# Patient Record
Sex: Female | Born: 1980 | Race: White | Hispanic: No | Marital: Single | Smoking: Never smoker
Health system: Southern US, Community
[De-identification: ages and names within clinical notes are randomized; demographics above are authoritative.]

## PROBLEM LIST (undated history)

## (undated) DIAGNOSIS — R87629 Unspecified abnormal cytological findings in specimens from vagina: Secondary | ICD-10-CM

## (undated) HISTORY — PX: NO PAST SURGERIES: SHX2092

## (undated) HISTORY — DX: Unspecified abnormal cytological findings in specimens from vagina: R87.629

---

## 2008-09-23 ENCOUNTER — Ambulatory Visit: Payer: Self-pay | Admitting: Family Medicine

## 2008-09-24 LAB — CONVERTED CEMR LAB
Calcium: 9.3 mg/dL (ref 8.4–10.5)
HDL: 42.1 mg/dL (ref >39.00)
LDL Cholesterol: 118 mg/dL — ABNORMAL HIGH (ref 0–99)
Potassium: 3.6 meq/L (ref 3.5–5.1)
Sodium: 140 meq/L (ref 135–145)
Total CHOL/HDL Ratio: 4
Triglycerides: 91 mg/dL (ref 0.0–149.0)
VLDL: 18.2 mg/dL (ref 0.0–40.0)

## 2008-11-20 ENCOUNTER — Ambulatory Visit: Payer: Self-pay | Admitting: Family Medicine

## 2008-11-20 ENCOUNTER — Encounter: Payer: Self-pay | Admitting: Family Medicine

## 2008-11-20 ENCOUNTER — Other Ambulatory Visit: Admission: RE | Admit: 2008-11-20 | Discharge: 2008-11-20 | Payer: Self-pay | Admitting: Family Medicine

## 2008-11-27 ENCOUNTER — Encounter (INDEPENDENT_AMBULATORY_CARE_PROVIDER_SITE_OTHER): Payer: Self-pay | Admitting: *Deleted

## 2009-02-25 ENCOUNTER — Encounter: Payer: Self-pay | Admitting: Family Medicine

## 2009-02-25 ENCOUNTER — Emergency Department (HOSPITAL_COMMUNITY): Admission: EM | Admit: 2009-02-25 | Discharge: 2009-02-26 | Payer: Self-pay | Admitting: Emergency Medicine

## 2009-02-26 ENCOUNTER — Encounter: Payer: Self-pay | Admitting: Family Medicine

## 2009-02-28 ENCOUNTER — Ambulatory Visit: Payer: Self-pay | Admitting: Family Medicine

## 2009-02-28 DIAGNOSIS — R5381 Other malaise: Secondary | ICD-10-CM

## 2009-02-28 DIAGNOSIS — R5383 Other fatigue: Secondary | ICD-10-CM

## 2009-02-28 DIAGNOSIS — G988 Other disorders of nervous system: Secondary | ICD-10-CM | POA: Insufficient documentation

## 2009-03-03 ENCOUNTER — Ambulatory Visit: Payer: Self-pay | Admitting: Internal Medicine

## 2010-02-10 NOTE — Assessment & Plan Note (Signed)
Summary: F/U  ER ON 02/25/09/CLE   Vital Signs:  Patient profile:   30 year old female Height:      66 inches Weight:      162.25 pounds BMI:     26.28 Temp:     98.8 degrees F oral Pulse rate:   88 / minute Pulse rhythm:   regular BP sitting:   122 / 70  (left arm) Cuff size:   regular  Vitals Entered By: Delilah Shan CMA Duncan Dull) (February 28, 2009 3:07 PM) CC: Follow-up visit - [facility]   History of Present Illness: 30 yo female here for hospital follow up. Seen at [facility] on 2/15 after being awoken by acute episode of bilateral hand tingling, headache, nausea and palpiations. EKG, BMET, CBC, UA, upreg normal, tox screen were all neg.  Has not had another episode since then but feels her left arm remains "achy" and weaker. No one witnessed the episode, unsure if she seizues, says she was not confused after it happened. Does not think she lost consciousness or hit her head.  No recent acute illnesses, fevers.  Current Medications (verified): 1)  None  Allergies (verified): No Known Drug Allergies  Review of Systems      See HPI General:  Denies chills, fever, and loss of appetite. CV:  Denies chest pain or discomfort. Resp:  Denies shortness of breath, sputum productive, and wheezing. Neuro:  Denies brief paralysis, difficulty with concentration, disturbances in coordination, falling down, inability to speak, memory loss, and tremors.  Physical Exam  General:  Well-developed,well-nourished,in no acute distress; alert,appropriate and cooperative throughout examination Eyes:  No corneal or conjunctival inflammation noted. EOMI. Perrla. Funduscopic exam benign, without hemorrhages, exudates or papilledema. Vision grossly normal. Ears:  External ear exam shows no significant lesions or deformities.  Otoscopic examination reveals clear canals, tympanic membranes are intact bilaterally without bulging, retraction, inflammation or discharge. Hearing is  grossly normal bilaterally. Mouth:  Oral mucosa and oropharynx without lesions or exudates.  Teeth in good repair. Lungs:  Normal respiratory effort, chest expands symmetrically. Lungs are clear to auscultation, no crackles or wheezes. Heart:  Normal rate and regular rhythm. S1 and S2 normal without gallop, murmur, click, rub or other extra sounds. Abdomen:  Bowel sounds positive,abdomen soft and non-tender without masses, organomegaly or hernias noted. Msk:  No deformity or scoliosis noted of thoracic or lumbar spine.   Pulses:  R and L carotid,radial,femoral,dorsalis pedis and posterior tibial pulses are full and equal bilaterally Extremities:  no edema Neurologic:  No cranial nerve deficits noted. Station and gait are normal. Plantar reflexes are down-going bilaterally. DTRs are symmetrical throughout. Sensory, motor and coordinative functions appear intact. Strength normal and equal in all extremities, included left arm.   Impression & Recommendations:  Problem # 1:  UNSPECIFIED DISORDERS OF NERVOUS SYSTEM (ICD-349.9) Assessment New s/p unexplained epsiode of unknown origin.  Has not occured again but given her age and residual subjective arm weakness, will get CT of head.  If normal, will consider holter monitor.  Advised to go to ER over the weekend should another episode occur. Orders: Radiology Referral (Radiology)  Patient Instructions: 1)  Please stop by to see Shirlee Limerick on your way out. 2)  Go to ER if symptoms return over the weekend.  Prior Medications (reviewed today): None Current Allergies (reviewed today): No known allergies

## 2010-02-23 ENCOUNTER — Encounter: Payer: Self-pay | Admitting: Family Medicine

## 2010-02-23 ENCOUNTER — Other Ambulatory Visit: Payer: Self-pay | Admitting: Family Medicine

## 2010-02-23 ENCOUNTER — Other Ambulatory Visit (HOSPITAL_COMMUNITY)
Admission: RE | Admit: 2010-02-23 | Discharge: 2010-02-23 | Disposition: A | Discharge status: Home / Self Care | Payer: BC Managed Care – PPO | Source: Ambulatory Visit | Attending: Family Medicine | Admitting: Family Medicine

## 2010-02-23 ENCOUNTER — Encounter (INDEPENDENT_AMBULATORY_CARE_PROVIDER_SITE_OTHER): Payer: BC Managed Care – PPO | Admitting: Family Medicine

## 2010-02-23 DIAGNOSIS — R5383 Other fatigue: Secondary | ICD-10-CM

## 2010-02-23 DIAGNOSIS — E559 Vitamin D deficiency, unspecified: Secondary | ICD-10-CM

## 2010-02-23 DIAGNOSIS — Z13220 Encounter for screening for lipoid disorders: Secondary | ICD-10-CM

## 2010-02-23 DIAGNOSIS — R5381 Other malaise: Secondary | ICD-10-CM

## 2010-02-23 DIAGNOSIS — Z0000 Encounter for general adult medical examination without abnormal findings: Secondary | ICD-10-CM

## 2010-02-23 DIAGNOSIS — Z1159 Encounter for screening for other viral diseases: Secondary | ICD-10-CM | POA: Insufficient documentation

## 2010-02-23 DIAGNOSIS — Z113 Encounter for screening for infections with a predominantly sexual mode of transmission: Secondary | ICD-10-CM | POA: Insufficient documentation

## 2010-02-23 LAB — CBC WITH DIFFERENTIAL/PLATELET
Basophils Absolute: 0 10*3/uL (ref 0.0–0.1)
Basophils Relative: 0.4 % (ref 0.0–3.0)
Eosinophils Relative: 2.2 % (ref 0.0–5.0)
HCT: 36.7 % (ref 36.0–46.0)
Lymphocytes Relative: 20.9 % (ref 12.0–46.0)
Neutro Abs: 4.5 10*3/uL (ref 1.4–7.7)
Neutrophils Relative %: 69.4 % (ref 43.0–77.0)
Platelets: 419 10*3/uL — ABNORMAL HIGH (ref 150.0–400.0)
RDW: 13.2 % (ref 11.5–14.6)

## 2010-02-23 LAB — LIPID PANEL
HDL: 46.3 mg/dL (ref >39.00)
Total CHOL/HDL Ratio: 4
Triglycerides: 45 mg/dL (ref 0.0–149.0)
VLDL: 9 mg/dL (ref 0.0–40.0)

## 2010-02-23 LAB — BASIC METABOLIC PANEL
CO2: 26 mEq/L (ref 19–32)
Chloride: 102 mEq/L (ref 96–112)
Potassium: 4.1 mEq/L (ref 3.5–5.1)

## 2010-02-23 LAB — TSH: TSH: 0.92 u[IU]/mL (ref 0.35–5.50)

## 2010-02-23 LAB — B12 AND FOLATE PANEL
Folate: 13.3 ng/mL (ref >5.9)
Vitamin B-12: 311 pg/mL (ref 211–911)

## 2010-02-26 ENCOUNTER — Encounter: Payer: Self-pay | Admitting: Family Medicine

## 2010-03-04 NOTE — Letter (Signed)
Summary: Results Follow up Letter  [place] [facility]  [address] [place], [place] [address]   Phone: [phone]  Fax: [phone]    02/26/2010 MRN: [id number]  Renee Bean [address] [place], [place]  [address]  [place]  Dear Ms. Trovato,  The following are the results of your recent test(s):  Test         Result    Pap Smear:        Normal __X___  Not Normal _____ Comments: GC/Chlamydia neg ______________________________________________________ Cholesterol: LDL(Bad cholesterol):         Your goal is less than:         HDL (Good cholesterol):       Your goal is more than: Comments:  ______________________________________________________ Mammogram:        Normal _____  Not Normal _____ Comments:  ___________________________________________________________________ Hemoccult:        Normal _____  Not normal _______ Comments:    _____________________________________________________________________ Other Tests:    We routinely do not discuss normal results over the telephone.  If you desire a copy of the results, or you have any questions about this information we can discuss them at your next office visit.   Sincerely,      Dr. Ruthe Mannan

## 2010-03-04 NOTE — Assessment & Plan Note (Signed)
Summary: CPX   Vital Signs:  Patient profile:   30 year old female Height:      66 inches Weight:      169.75 pounds BMI:     27.50 Temp:     97.3 degrees F oral Pulse rate:   81 / minute Pulse rhythm:   regular BP sitting:   120 / 78  (left arm) Cuff size:   regular  Vitals Entered By: Linde Gillis CMA Duncan Dull) (February 23, 2010 9:22 AM) CC: complete physical with pap   History of Present Illness: 30 yo here for CPX.   G0, sexually active with same partner for several years, uses condoms.  Has an aunt that had some kid of "female cancer," but she is unsure of type.  No h/o abnormal pap smears.  Periods are regular and not too heavy.  SOB- over past few months, notices DOE when she is climbing stairs at work.  No cough, wheezing or CP.  No SOB at rest.  Does not feel dizzy when she stands up.   Pt is a non smoker. Admits to not exercising as much as she has in past. Does feel a little more fatigued.   Current Medications (verified): 1)  None  Allergies (verified): No Known Drug Allergies  Past History:  Family History: Last updated: 09/23/2008 Mom diagnosed with colon CA at 23 cousins and grandparents- DM II, one cousin with DM Type I HTN-both parents, both sets of grandparents.  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  Does not drink or smokee.  Monogamous with boyfriend who lives in [place].    Risk Factors: Alcohol Use: 0 (09/23/2008) Exercise: yes (09/23/2008)  Risk Factors: Smoking Status: never (09/23/2008)  Review of Systems      See HPI General:  Complains of fatigue. Eyes:  Denies blurring. ENT:  Denies difficulty swallowing. CV:  Denies chest pain or discomfort. Resp:  Complains of shortness of breath; denies chest discomfort, chest pain with inspiration, cough, and wheezing. GI:  Denies abdominal pain and bloody stools. GU:  Denies abnormal vaginal bleeding and  dysuria. MS:  Denies joint pain, joint redness, and joint swelling. Derm:  Denies rash. Neuro:  Denies headaches. Psych:  Denies anxiety and depression. Endo:  Denies cold intolerance, heat intolerance, and polyuria. Heme:  Denies abnormal bruising and bleeding.  Physical Exam  General:  Well-developed,well-nourished,in no acute distress; alert,appropriate and cooperative throughout examination Head:  normocephalic, atraumatic, no abnormalities observed, and no abnormalities palpated.   Eyes:  vision grossly intact, pupils equal, pupils round, and pupils reactive to light.   Ears:  R ear normal and L ear normal.   Nose:  no external deformity.   Mouth:  good dentition.   Neck:  No deformities, masses, or tenderness noted. Breasts:  No mass, nodules, thickening, tenderness, bulging, retraction, inflamation, nipple discharge or skin changes noted.   Lungs:  Normal respiratory effort, chest expands symmetrically. Lungs are clear to auscultation, no crackles or wheezes. Heart:  Normal rate and regular rhythm. S1 and S2 normal without gallop, murmur, click, rub or other extra sounds. Abdomen:  Bowel sounds positive,abdomen soft and non-tender without masses, organomegaly or hernias noted. Rectal:  no external abnormalities.   Genitalia:  Pelvic Exam:        External: normal female genitalia without lesions or masses        Vagina: normal without lesions or masses  Cervix: normal without lesions or masses        Adnexa: normal bimanual exam without masses or fullness        Uterus: normal by palpation        Pap smear: performed Msk:  No deformity or scoliosis noted of thoracic or lumbar spine.   Extremities:  no edema Neurologic:  alert & oriented X3, cranial nerves II-XII intact, and gait normal.   Skin:  Intact without suspicious lesions or rashes Cervical Nodes:  No lymphadenopathy noted Axillary Nodes:  No palpable lymphadenopathy Psych:  Cognition and judgment appear intact.  Alert and cooperative with normal attention span and concentration. No apparent delusions, illusions, hallucinations   Impression & Recommendations:  Problem # 1:  Preventive Health Care (ICD-V70.0) Reviewed preventive care protocols, scheduled due services, and updated immunizations Discussed nutrition, exercise, diet, and healthy lifestyle.  Pap smear today. BMET, fasting lipid panel.  Problem # 2:  FATIGUE (ICD-780.79) Assessment: New with dyspnea on exertion. ?deconditioning.  Lungs clear on exam. Will check blood work to rule out other possible causes. Pt is low risk for chest malignancy or other pathology.  If blood work normal and symptoms persist, consider CXR. Orders: TLB-B12 + Folate Pnl (04540_98119-J47/WGN) TLB-CBC Platelet - w/Differential (85025-CBCD) TLB-TSH (Thyroid Stimulating Hormone) (84443-TSH) TLB-BMP (Basic Metabolic Panel-BMET) (80048-METABOL)  Other Orders: Venipuncture (56213) TLB-Lipid Panel (80061-LIPID) T-Vitamin D (25-Hydroxy) (08657-84696) Specimen Handling (29528)   Orders Added: 1)  Venipuncture [41324] 2)  TLB-Lipid Panel [80061-LIPID] 3)  TLB-B12 + Folate Pnl [82746_82607-B12/FOL] 4)  TLB-CBC Platelet - w/Differential [85025-CBCD] 5)  TLB-TSH (Thyroid Stimulating Hormone) [84443-TSH] 6)  TLB-BMP (Basic Metabolic Panel-BMET) [80048-METABOL] 7)  T-Vitamin D (25-Hydroxy) [40102-72536] 8)  Specimen Handling [99000] 9)  Est. Patient 18-39 years [99395] 10)  Est. Patient Level II [64403]    Prior Medications (reviewed today): None Current Allergies (reviewed today): No known allergies

## 2010-04-02 LAB — CBC
HCT: 35.7 % — ABNORMAL LOW (ref 36.0–46.0)
Platelets: 356 10*3/uL (ref 150–400)
RDW: 12.6 % (ref 11.5–15.5)
WBC: 9.2 10*3/uL (ref 4.0–10.5)

## 2010-04-02 LAB — URINALYSIS, ROUTINE W REFLEX MICROSCOPIC
Nitrite: NEGATIVE
Protein, ur: NEGATIVE mg/dL
Specific Gravity, Urine: 1.015 (ref 1.005–1.030)
Urobilinogen, UA: 0.2 mg/dL (ref 0.0–1.0)

## 2010-04-02 LAB — BASIC METABOLIC PANEL
BUN: 9 mg/dL (ref 6–23)
Calcium: 9.5 mg/dL (ref 8.4–10.5)
Creatinine, Ser: 0.67 mg/dL (ref 0.4–1.2)
GFR calc non Af Amer: >60 mL/min (ref >60)
Glucose, Bld: 99 mg/dL (ref 70–99)
Potassium: 3.2 mEq/L — ABNORMAL LOW (ref 3.5–5.1)

## 2010-04-02 LAB — PREGNANCY, URINE: Preg Test, Ur: NEGATIVE

## 2010-04-02 LAB — ETHANOL: Alcohol, Ethyl (B): <5 mg/dL (ref 0–10)

## 2010-04-02 LAB — RAPID URINE DRUG SCREEN, HOSP PERFORMED
Amphetamines: NOT DETECTED
Barbiturates: NOT DETECTED
Opiates: NOT DETECTED

## 2010-06-25 ENCOUNTER — Encounter: Payer: Self-pay | Admitting: Family Medicine

## 2010-06-25 ENCOUNTER — Ambulatory Visit (INDEPENDENT_AMBULATORY_CARE_PROVIDER_SITE_OTHER): Payer: BC Managed Care – PPO | Admitting: Family Medicine

## 2010-06-25 VITALS — BP 110/80 | HR 79 | Temp 98.4°F | Ht 66.0 in | Wt 168.5 lb

## 2010-06-25 DIAGNOSIS — R51 Headache: Secondary | ICD-10-CM

## 2010-06-25 MED ORDER — ESOMEPRAZOLE MAGNESIUM 40 MG PO CPDR: 40.0000 mg | DELAYED_RELEASE_CAPSULE | Freq: Every day | ORAL | Status: DC

## 2010-06-25 NOTE — Patient Instructions (Signed)
Your headaches seem to be related to sinus congestion. Try claritin D or zyrtec D over the counter- two times a day as needed ( have to sign for them at pharmacy).  Call if not improving as expected in 5-7 days.

## 2010-06-25 NOTE — Progress Notes (Signed)
30 yo here for headaches.  Had some sinus congestion for past few days and intermittent headaches as well. Headaches are bilateral frontal, right ear was hurting as well. No cough, fever, chills, SOB or CP.   No nausea, vomiting, photophobia or focal neurological deficits.  Family History  Problem Relation Age of Onset  . Cancer Mother 5    colon  . Hypertension Mother   . Hypertension Father   . Hypertension Maternal Grandmother   . Diabetes Maternal Grandmother   . Hypertension Maternal Grandfather   . Diabetes Maternal Grandfather   . Hypertension Paternal Grandmother   . Diabetes Paternal Grandmother   . Hypertension Paternal Grandfather   . Diabetes Paternal Grandfather     Review of Systems       See HPI   Physical Exam BP 110/80  Pulse 79  Temp(Src) 98.4 F (36.9 C) (Oral)  Ht 5\' 6"  (1.676 m)  Wt 168 lb 8 oz (76.431 kg)  BMI 27.20 kg/m2  LMP 06/08/2010 General:  Well-developed,well-nourished,in no acute distress; alert,appropriate and cooperative throughout examination Head:  normocephalic, atraumatic, no abnormalities observed, and no abnormalities palpated.   Eyes:  vision grossly intact, pupils equal, pupils round, and pupils reactive to light.   Ears:  R ear dull, Left TM normal. Nose:  Mild erythema, sinuses NTTP Mouth:  good dentition.   Neck:  No deformities, masses, or tenderness noted. Lungs:  Normal respiratory effort, chest expands symmetrically. Lungs are clear to auscultation, no crackles or wheezes. Heart:  Normal rate and regular rhythm. S1 and S2 normal without gallop, murmur, click, rub or other extra sounds. Abdomen:  Bowel sounds positive,abdomen soft and non-tender without masses, organomegaly or hernias noted. Msk:  No deformity or scoliosis noted of thoracic or lumbar spine.   Extremities:  no edema Neurologic:  alert & oriented X3, cranial nerves II-XII intact, and gait normal.   Skin:  Intact without suspicious lesions or  rashes Cervical Nodes:  No lymphadenopathy noted Axillary Nodes:  No palpable lymphadenopathy Psych:  Cognition and judgment appear intact. Alert and cooperative with normal attention span and concentration. No apparent delusions, illusions, hallucinations  1. Generalized headaches    New- most consistent with sinus related headaches. Supportive care, see pt instructions for details.

## 2010-10-19 ENCOUNTER — Other Ambulatory Visit: Payer: Self-pay | Admitting: Family Medicine

## 2012-05-30 ENCOUNTER — Telehealth: Payer: Self-pay | Admitting: Family Medicine

## 2012-05-30 NOTE — Telephone Encounter (Signed)
Ok to accommodate but not last appt of day. Thanks.

## 2012-05-30 NOTE — Telephone Encounter (Signed)
Pt has a CPE scheduled for July, but her ins is requiring she have one prior to the end of June. Can you accommodate her an apptmt for this?

## 2012-05-30 NOTE — Telephone Encounter (Signed)
Pt r/s for 06/08/2012

## 2012-06-08 ENCOUNTER — Ambulatory Visit (INDEPENDENT_AMBULATORY_CARE_PROVIDER_SITE_OTHER): Payer: BC Managed Care – PPO | Admitting: Family Medicine

## 2012-06-08 ENCOUNTER — Other Ambulatory Visit (HOSPITAL_COMMUNITY)
Admission: RE | Admit: 2012-06-08 | Discharge: 2012-06-08 | Disposition: A | Discharge status: Home / Self Care | Payer: BC Managed Care – PPO | Source: Ambulatory Visit | Attending: Family Medicine | Admitting: Family Medicine

## 2012-06-08 ENCOUNTER — Encounter: Payer: Self-pay | Admitting: Family Medicine

## 2012-06-08 VITALS — BP 120/88 | HR 87 | Temp 97.6°F | Ht 66.0 in | Wt 178.2 lb

## 2012-06-08 DIAGNOSIS — Z7689 Persons encountering health services in other specified circumstances: Secondary | ICD-10-CM | POA: Insufficient documentation

## 2012-06-08 DIAGNOSIS — Z136 Encounter for screening for cardiovascular disorders: Secondary | ICD-10-CM

## 2012-06-08 DIAGNOSIS — Z01419 Encounter for gynecological examination (general) (routine) without abnormal findings: Secondary | ICD-10-CM | POA: Insufficient documentation

## 2012-06-08 DIAGNOSIS — Z0000 Encounter for general adult medical examination without abnormal findings: Secondary | ICD-10-CM

## 2012-06-08 LAB — COMPREHENSIVE METABOLIC PANEL
Albumin: 4 g/dL (ref 3.5–5.2)
Alkaline Phosphatase: 68 U/L (ref 39–117)
BUN: 10 mg/dL (ref 6–23)
Calcium: 9.7 mg/dL (ref 8.4–10.5)
Chloride: 106 mEq/L (ref 96–112)
Glucose, Bld: 88 mg/dL (ref 70–99)
Potassium: 3.6 mEq/L (ref 3.5–5.1)
Sodium: 137 mEq/L (ref 135–145)
Total Protein: 7.4 g/dL (ref 6.0–8.3)

## 2012-06-08 LAB — CBC WITH DIFFERENTIAL/PLATELET
Basophils Relative: 0.6 % (ref 0.0–3.0)
HCT: 37.5 % (ref 36.0–46.0)
Hemoglobin: 12.8 g/dL (ref 12.0–15.0)
Lymphocytes Relative: 20.2 % (ref 12.0–46.0)
Lymphs Abs: 1.4 10*3/uL (ref 0.7–4.0)
MCHC: 34 g/dL (ref 30.0–36.0)
Monocytes Relative: 6.6 % (ref 3.0–12.0)
Neutro Abs: 4.7 10*3/uL (ref 1.4–7.7)
RBC: 4.51 Mil/uL (ref 3.87–5.11)

## 2012-06-08 LAB — LIPID PANEL
Cholesterol: 195 mg/dL (ref 0–200)
LDL Cholesterol: 129 mg/dL — ABNORMAL HIGH (ref 0–99)
Total CHOL/HDL Ratio: 4
Triglycerides: 103 mg/dL (ref 0.0–149.0)

## 2012-06-08 NOTE — Progress Notes (Signed)
Subjective:    Patient ID: Renee Bean, female    DOB: Oct 14, 1980, 32 y.o.   MRN: [id number]  HPI  32 yo female whom I have not seen in 2 years here for CPX.  G0, sexually active with same partner for several years, uses condoms. Has an aunt that had some kid of "female cancer," but she is unsure of type.  No h/o abnormal pap smears. Periods are regular and not too heavy.  She has no complaints today.  Lab Results  Component Value Date   CHOL 177 02/23/2010   HDL 46.30 02/23/2010   LDLCALC 122* 02/23/2010   TRIG 45.0 02/23/2010   CHOLHDL 4 02/23/2010   Patient Active Problem List   Diagnosis Date Noted  . Routine general medical examination at a health care facility 06/08/2012  . Generalized headaches 06/25/2010  . UNSPECIFIED DISORDERS OF NERVOUS SYSTEM 02/28/2009   No past medical history on file. No past surgical history on file. History  Substance Use Topics  . Smoking status: Never Smoker   . Smokeless tobacco: Not on file  . Alcohol Use: No   Family History  Problem Relation Age of Onset  . Cancer Mother 28    colon  . Hypertension Mother   . Hypertension Father   . Hypertension Maternal Grandmother   . Diabetes Maternal Grandmother   . Hypertension Maternal Grandfather   . Diabetes Maternal Grandfather   . Hypertension Paternal Grandmother   . Diabetes Paternal Grandmother   . Hypertension Paternal Grandfather   . Diabetes Paternal Grandfather    No Known Allergies No current outpatient prescriptions on file prior to visit.   No current facility-administered medications on file prior to visit.   The PMH, PSH, Social History, Family History, Medications, and allergies have been reviewed in [facility], and have been updated if relevant.    Review of Systems See HPI Patient reports no  vision/ hearing changes,anorexia, weight change, fever ,adenopathy, persistant / recurrent hoarseness, swallowing issues, chest pain, edema,persistant / recurrent cough,  hemoptysis, dyspnea(rest, exertional, paroxysmal nocturnal), gastrointestinal  bleeding (melena, rectal bleeding), abdominal pain, excessive heart burn, GU symptoms(dysuria, hematuria, pyuria, voiding/incontinence  Issues) syncope, focal weakness, severe memory loss, concerning skin lesions, depression, anxiety, abnormal bruising/bleeding, major joint swelling, breast masses or abnormal vaginal bleeding.       Objective:   Physical Exam BP 120/88  Pulse 87  Temp(Src) 97.6 F (36.4 C) (Oral)  Ht 5\' 6"  (1.676 m)  Wt 178 lb 4 oz (80.854 kg)  BMI 28.78 kg/m2  SpO2 98%  General:  Well-developed,well-nourished,in no acute distress; alert,appropriate and cooperative throughout examination Head:  normocephalic and atraumatic.   Eyes:  vision grossly intact, pupils equal, pupils round, and pupils reactive to light.   Ears:  R ear normal and L ear normal.   Nose:  no external deformity.   Mouth:  good dentition.   Neck:  No deformities, masses, or tenderness noted. Breasts:  No mass, nodules, thickening, tenderness, bulging, retraction, inflamation, nipple discharge or skin changes noted.   Lungs:  Normal respiratory effort, chest expands symmetrically. Lungs are clear to auscultation, no crackles or wheezes. Heart:  Normal rate and regular rhythm. S1 and S2 normal without gallop, murmur, click, rub or other extra sounds. Abdomen:  Bowel sounds positive,abdomen soft and non-tender without masses, organomegaly or hernias noted. Rectal:  no external abnormalities.   Genitalia:  Pelvic Exam:        External: normal female genitalia without lesions or masses  Vagina: normal without lesions or masses        Cervix: normal without lesions or masses        Adnexa: normal bimanual exam without masses or fullness        Uterus: normal by palpation        Pap smear: performed Msk:  No deformity or scoliosis noted of thoracic or lumbar spine.   Extremities:  No clubbing, cyanosis, edema, or  deformity noted with normal full range of motion of all joints.   Neurologic:  alert & oriented X3 and gait normal.   Skin:  Intact without suspicious lesions or rashes Cervical Nodes:  No lymphadenopathy noted Axillary Nodes:  No palpable lymphadenopathy Psych:  Cognition and judgment appear intact. Alert and cooperative with normal attention span and concentration. No apparent delusions, illusions, hallucinations        Assessment & Plan:  1. Routine general medical examination at a health care facility Reviewed preventive care protocols, scheduled due services, and updated immunizations Discussed nutrition, exercise, diet, and healthy lifestyle.  Mom had colon CA at 53- discussed screening colonoscopy at 73.  The patient indicates understanding of these issues and agrees with the plan.   - Cytology - PAP - Comprehensive metabolic panel - CBC with Differential  2. Screening for ischemic heart disease  - Lipid Panel

## 2012-06-08 NOTE — Patient Instructions (Addendum)
Great to see you. We will call you with your lab and pap smear results.  Have a great summer.

## 2012-06-09 ENCOUNTER — Encounter: Payer: Self-pay | Admitting: *Deleted

## 2012-06-13 ENCOUNTER — Encounter: Payer: Self-pay | Admitting: *Deleted

## 2012-06-13 ENCOUNTER — Encounter: Payer: Self-pay | Admitting: Family Medicine

## 2012-07-24 ENCOUNTER — Encounter: Payer: BC Managed Care – PPO | Admitting: Family Medicine

## 2012-09-12 ENCOUNTER — Ambulatory Visit (INDEPENDENT_AMBULATORY_CARE_PROVIDER_SITE_OTHER): Payer: BC Managed Care – PPO | Admitting: Family Medicine

## 2012-09-12 ENCOUNTER — Ambulatory Visit (INDEPENDENT_AMBULATORY_CARE_PROVIDER_SITE_OTHER)
Admission: RE | Admit: 2012-09-12 | Discharge: 2012-09-12 | Disposition: A | Discharge status: Home / Self Care | Payer: BC Managed Care – PPO | Source: Ambulatory Visit | Attending: Family Medicine | Admitting: Family Medicine

## 2012-09-12 ENCOUNTER — Encounter: Payer: Self-pay | Admitting: Family Medicine

## 2012-09-12 VITALS — BP 110/80 | HR 80 | Temp 98.6°F | Ht 66.0 in | Wt 174.0 lb

## 2012-09-12 DIAGNOSIS — M25569 Pain in unspecified knee: Secondary | ICD-10-CM

## 2012-09-12 DIAGNOSIS — M25561 Pain in right knee: Secondary | ICD-10-CM | POA: Insufficient documentation

## 2012-09-12 NOTE — Patient Instructions (Addendum)
Good to see you. Let's get an xray today.  We will call you with xray results.  Continue Ibuprofen/Alleve with food.  Heat may help as well.

## 2012-09-12 NOTE — Progress Notes (Signed)
SUBJECTIVE: Renee Bean is a 32 y.o. female who complaints of right knee pain and swelling x 2 weeks.  No known injury.   Woke up one morning with these symptoms.  Pain worsens after she has been on her feet all day or if she bends down.   Prior history of related problems: no prior problems with this area in the past.  Patient Active Problem List   Diagnosis Date Noted  . Right knee pain 09/12/2012   No past medical history on file. No past surgical history on file. History  Substance Use Topics  . Smoking status: Never Smoker   . Smokeless tobacco: Not on file  . Alcohol Use: No   Family History  Problem Relation Age of Onset  . Cancer Mother 41    colon  . Hypertension Mother   . Hypertension Father   . Hypertension Maternal Grandmother   . Diabetes Maternal Grandmother   . Hypertension Maternal Grandfather   . Diabetes Maternal Grandfather   . Hypertension Paternal Grandmother   . Diabetes Paternal Grandmother   . Hypertension Paternal Grandfather   . Diabetes Paternal Grandfather    No Known Allergies No current outpatient prescriptions on file prior to visit.   No current facility-administered medications on file prior to visit.   The PMH, PSH, Social History, Family History, Medications, and allergies have been reviewed in [facility], and have been updated if relevant.  OBJECTIVE: BP 110/80  Pulse 80  Temp(Src) 98.6 F (37 C)  Ht 5\' 6"  (1.676 m)  Wt 174 lb (78.926 kg)  BMI 28.1 kg/m2  Appearance: alert, well appearing, and in no distress and oriented to person, place, and time. Knee exam: effusion, reduced range of motion, negative drawer sign, negative Lachman sign. Normal gait X-ray: ordered, but results not yet available.  ASSESSMENT: Knee pain/swelling- etiology unclear- ?baker's cyst vs sprain  PLAN: rest the injured area as much as practical, X-Ray ordered Advised NSAIDS with food. See orders for this visit as documented in the electronic medical  record.

## 2013-06-11 ENCOUNTER — Encounter: Payer: Self-pay | Admitting: Internal Medicine

## 2013-06-11 ENCOUNTER — Ambulatory Visit (INDEPENDENT_AMBULATORY_CARE_PROVIDER_SITE_OTHER): Payer: BC Managed Care – PPO | Admitting: Internal Medicine

## 2013-06-11 VITALS — BP 104/64 | HR 91 | Temp 98.2°F | Ht 65.5 in | Wt 175.0 lb

## 2013-06-11 DIAGNOSIS — Z0000 Encounter for general adult medical examination without abnormal findings: Secondary | ICD-10-CM

## 2013-06-11 LAB — CBC
HCT: 37.8 % (ref 36.0–46.0)
HEMOGLOBIN: 12.6 g/dL (ref 12.0–15.0)
MCHC: 33.3 g/dL (ref 30.0–36.0)
MCV: 81.6 fl (ref 78.0–100.0)
Platelets: 428 10*3/uL — ABNORMAL HIGH (ref 150.0–400.0)
RBC: 4.64 Mil/uL (ref 3.87–5.11)
RDW: 13.8 % (ref 11.5–15.5)
WBC: 7.1 10*3/uL (ref 4.0–10.5)

## 2013-06-11 LAB — COMPREHENSIVE METABOLIC PANEL
ALT: 12 U/L (ref 0–35)
AST: 13 U/L (ref 0–37)
Albumin: 4 g/dL (ref 3.5–5.2)
Alkaline Phosphatase: 65 U/L (ref 39–117)
BUN: 8 mg/dL (ref 6–23)
CALCIUM: 9.4 mg/dL (ref 8.4–10.5)
CHLORIDE: 108 meq/L (ref 96–112)
CO2: 25 meq/L (ref 19–32)
Creatinine, Ser: 0.7 mg/dL (ref 0.4–1.2)
GFR: 110.16 mL/min (ref >60.00)
Glucose, Bld: 89 mg/dL (ref 70–99)
POTASSIUM: 3.9 meq/L (ref 3.5–5.1)
SODIUM: 139 meq/L (ref 135–145)
TOTAL PROTEIN: 7.5 g/dL (ref 6.0–8.3)
Total Bilirubin: 0.5 mg/dL (ref 0.2–1.2)

## 2013-06-11 LAB — TSH: TSH: 1.16 u[IU]/mL (ref 0.35–4.50)

## 2013-06-11 LAB — LIPID PANEL
CHOL/HDL RATIO: 4
Cholesterol: 183 mg/dL (ref 0–200)
HDL: 41.6 mg/dL (ref >39.00)
LDL CALC: 124 mg/dL — AB (ref 0–99)
Triglycerides: 89 mg/dL (ref 0.0–149.0)
VLDL: 17.8 mg/dL (ref 0.0–40.0)

## 2013-06-11 NOTE — Patient Instructions (Addendum)
Health Maintenance, Female A healthy lifestyle and preventative care can promote health and wellness.  Maintain regular health, dental, and eye exams.  Eat a healthy diet. Foods like vegetables, fruits, whole grains, low-fat dairy products, and lean protein foods contain the nutrients you need without too many calories. Decrease your intake of foods high in solid fats, added sugars, and salt. Get information about a proper diet from your caregiver, if necessary.  Regular physical exercise is one of the most important things you can do for your health. Most adults should get at least 150 minutes of moderate-intensity exercise (any activity that increases your heart rate and causes you to sweat) each week. In addition, most adults need muscle-strengthening exercises on 2 or more days a week.   Maintain a healthy weight. The body mass index (BMI) is a screening tool to identify possible weight problems. It provides an estimate of body fat based on height and weight. Your caregiver can help determine your BMI, and can help you achieve or maintain a healthy weight. For adults 20 years and older:  A BMI below 18.5 is considered underweight.  A BMI of 18.5 to 24.9 is normal.  A BMI of 25 to 29.9 is considered overweight.  A BMI of 30 and above is considered obese.  Maintain normal blood lipids and cholesterol by exercising and minimizing your intake of saturated fat. Eat a balanced diet with plenty of fruits and vegetables. Blood tests for lipids and cholesterol should begin at age 20 and be repeated every 5 years. If your lipid or cholesterol levels are high, you are over 50, or you are a high risk for heart disease, you may need your cholesterol levels checked more frequently.Ongoing high lipid and cholesterol levels should be treated with medicines if diet and exercise are not effective.  If you smoke, find out from your caregiver how to quit. If you do not use tobacco, do not start.  Lung  cancer screening is recommended for adults aged 55 80 years who are at high risk for developing lung cancer because of a history of smoking. Yearly low-dose computed tomography (CT) is recommended for people who have at least a 30-pack-year history of smoking and are a current smoker or have quit within the past 15 years. A pack year of smoking is smoking an average of 1 pack of cigarettes a day for 1 year (for example: 1 pack a day for 30 years or 2 packs a day for 15 years). Yearly screening should continue until the smoker has stopped smoking for at least 15 years. Yearly screening should also be stopped for people who develop a health problem that would prevent them from having lung cancer treatment.  If you are pregnant, do not drink alcohol. If you are breastfeeding, be very cautious about drinking alcohol. If you are not pregnant and choose to drink alcohol, do not exceed 1 drink per day. One drink is considered to be 12 ounces (355 mL) of beer, 5 ounces (148 mL) of wine, or 1.5 ounces (44 mL) of liquor.  Avoid use of street drugs. Do not share needles with anyone. Ask for help if you need support or instructions about stopping the use of drugs.  High blood pressure causes heart disease and increases the risk of stroke. Blood pressure should be checked at least every 1 to 2 years. Ongoing high blood pressure should be treated with medicines, if weight loss and exercise are not effective.  If you are 55 to   35 years old, ask your caregiver if you should take aspirin to prevent strokes.  Diabetes screening involves taking a blood sample to check your fasting blood sugar level. This should be done once every 3 years, after age 53, if you are within normal weight and without risk factors for diabetes. Testing should be considered at a younger age or be carried out more frequently if you are overweight and have at least 1 risk factor for diabetes.  Breast cancer screening is essential preventative care  for women. You should practice "breast self-awareness." This means understanding the normal appearance and feel of your breasts and may include breast self-examination. Any changes detected, no matter how small, should be reported to a caregiver. Women in their 67s and 30s should have a clinical breast exam (CBE) by a caregiver as part of a regular health exam every 1 to 3 years. After age 12, women should have a CBE every year. Starting at age 83, women should consider having a mammogram (breast X-ray) every year. Women who have a family history of breast cancer should talk to their caregiver about genetic screening. Women at a high risk of breast cancer should talk to their caregiver about having an MRI and a mammogram every year.  Breast cancer gene (BRCA)-related cancer risk assessment is recommended for women who have family members with BRCA-related cancers. BRCA-related cancers include breast, ovarian, tubal, and peritoneal cancers. Having family members with these cancers may be associated with an increased risk for harmful changes (mutations) in the breast cancer genes BRCA1 and BRCA2. Results of the assessment will determine the need for genetic counseling and BRCA1 and BRCA2 testing.  The Pap test is a screening test for cervical cancer. Women should have a Pap test starting at age 46. Between ages 48 and 97, Pap tests should be repeated every 2 years. Beginning at age 34, you should have a Pap test every 3 years as long as the past 3 Pap tests have been normal. If you had a hysterectomy for a problem that was not cancer or a condition that could lead to cancer, then you no longer need Pap tests. If you are between ages 47 and 58, and you have had normal Pap tests going back 10 years, you no longer need Pap tests. If you have had past treatment for cervical cancer or a condition that could lead to cancer, you need Pap tests and screening for cancer for at least 20 years after your treatment. If Pap  tests have been discontinued, risk factors (such as a new sexual partner) need to be reassessed to determine if screening should be resumed. Some women have medical problems that increase the chance of getting cervical cancer. In these cases, your caregiver may recommend more frequent screening and Pap tests.  The human papillomavirus (HPV) test is an additional test that may be used for cervical cancer screening. The HPV test looks for the virus that can cause the cell changes on the cervix. The cells collected during the Pap test can be tested for HPV. The HPV test could be used to screen women aged 41 years and older, and should be used in women of any age who have unclear Pap test results. After the age of 80, women should have HPV testing at the same frequency as a Pap test.  Colorectal cancer can be detected and often prevented. Most routine colorectal cancer screening begins at the age of 75 and continues through age 63. However, your caregiver  may recommend screening at an earlier age if you have risk factors for colon cancer. On a yearly basis, your caregiver may provide home test kits to check for hidden blood in the stool. Use of a small camera at the end of a tube, to directly examine the colon (sigmoidoscopy or colonoscopy), can detect the earliest forms of colorectal cancer. Talk to your caregiver about this at age 73, when routine screening begins. Direct examination of the colon should be repeated every 5 to 10 years through age 61, unless early forms of pre-cancerous polyps or small growths are found.  Hepatitis C blood testing is recommended for all people born from [birth year] through 1967 and any individual with known risks for hepatitis C.  Practice safe sex. Use condoms and avoid high-risk sexual practices to reduce the spread of sexually transmitted infections (STIs). Sexually active women aged 38 and younger should be checked for Chlamydia, which is a common sexually transmitted infection.  Older women with new or multiple partners should also be tested for Chlamydia. Testing for other STIs is recommended if you are sexually active and at increased risk.  Osteoporosis is a disease in which the bones lose minerals and strength with aging. This can result in serious bone fractures. The risk of osteoporosis can be identified using a bone density scan. Women ages 44 and over and women at risk for fractures or osteoporosis should discuss screening with their caregivers. Ask your caregiver whether you should be taking a calcium supplement or vitamin D to reduce the rate of osteoporosis.  Menopause can be associated with physical symptoms and risks. Hormone replacement therapy is available to decrease symptoms and risks. You should talk to your caregiver about whether hormone replacement therapy is right for you.  Use sunscreen. Apply sunscreen liberally and repeatedly throughout the day. You should seek shade when your shadow is shorter than you. Protect yourself by wearing long sleeves, pants, a wide-brimmed hat, and sunglasses year round, whenever you are outdoors.  Notify your caregiver of new moles or changes in moles, especially if there is a change in shape or color. Also notify your caregiver if a mole is larger than the size of a pencil eraser.  Stay current with your immunizations. Document Released: 07/13/2010 Document Revised: 04/24/2012 Document Reviewed: 07/13/2010 [facility] Patient Information 2014 [place].

## 2013-06-11 NOTE — Progress Notes (Signed)
Subjective:    Patient ID: Renee Bean, female    DOB: [birth year], 33 y.o.   MRN: [id number]  HPI  Pt presents to the clinic today for her annual physical. She has no concerns today.  Flu: never Tetanus: 2010 LMP: 05/12/13 Pap Smear: 06/13/2012 Dentist: [facility]  Review of Systems      No past medical history on file.  No current outpatient prescriptions on file.   No current facility-administered medications for this visit.    No Known Allergies  Family History  Problem Relation Age of Onset  . Cancer Mother 13    colon  . Hypertension Mother   . Hypertension Father   . Hypertension Maternal Grandmother   . Diabetes Maternal Grandmother   . Hypertension Maternal Grandfather   . Diabetes Maternal Grandfather   . Hypertension Paternal Grandmother   . Diabetes Paternal Grandmother   . Hypertension Paternal Grandfather   . Diabetes Paternal Grandfather     History   Social History  . Marital Status: Single    Spouse Name: N/A    Number of Children: N/A  . Years of Education: N/A   Occupational History  . Not on file.   Social History Main Topics  . Smoking status: Never Smoker   . Smokeless tobacco: Not on file  . Alcohol Use: No  . Drug Use:   . Sexual Activity:    Other Topics Concern  . 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  Monogamous with boyfriend who lives in [place].     Constitutional: Denies fever, malaise, fatigue, headache or abrupt weight changes.  HEENT: Denies eye pain, eye redness, ear pain, ringing in the ears, wax buildup, runny nose, nasal congestion, bloody nose, or sore throat. Respiratory: Denies difficulty breathing, shortness of breath, cough or sputum production.   Cardiovascular: Denies chest pain, chest tightness, palpitations or swelling in the hands or feet.  Gastrointestinal: Pt reports reflux. Denies abdominal pain, bloating,  constipation, diarrhea or blood in the stool.  GU: Denies urgency, frequency, pain with urination, burning sensation, blood in urine, odor or discharge. Musculoskeletal: Denies decrease in range of motion, difficulty with gait, muscle pain or joint pain and swelling.  Skin: Denies redness, rashes, lesions or ulcercations.  Neurological: Denies dizziness, difficulty with memory, difficulty with speech or problems with balance and coordination.   No other specific complaints in a complete review of systems (except as listed in HPI above).  Objective:   Physical Exam  BP 104/64  Pulse 91  Temp(Src) 98.2 F (36.8 C) (Oral)  Ht 5' 5.5" (1.664 m)  Wt 175 lb (79.379 kg)  BMI 28.67 kg/m2  SpO2 99%  LMP 05/12/2013 Wt Readings from Last 3 Encounters:  06/11/13 175 lb (79.379 kg)  09/12/12 174 lb (78.926 kg)  06/08/12 178 lb 4 oz (80.854 kg)    General: Appears her stated age, overweight but  well developed, well nourished in NAD. Skin: Warm, dry and intact. No rashes, lesions or ulcerations noted. HEENT: Head: normal shape and size; Eyes: sclera white, no icterus, conjunctiva pink, PERRLA and EOMs intact; Ears: Tm's gray and intact, normal light reflex; Nose: mucosa pink and moist, septum midline; Throat/Mouth: Teeth present, mucosa pink and moist, no exudate, lesions or ulcerations noted.  Neck: Normal range of motion. Neck supple, trachea midline. No massses, lumps or thyromegaly present.  Cardiovascular: Normal rate and rhythm. S1,S2 noted.  No murmur, rubs or gallops noted. No JVD or BLE edema. No carotid bruits noted. Pulmonary/Chest: Normal effort and positive vesicular breath sounds. No respiratory distress. No wheezes, rales or ronchi noted.  Abdomen: Soft and nontender. Normal bowel sounds, no bruits noted. No distention or masses noted. Liver, spleen and kidneys non palpable. Musculoskeletal: Normal range of motion. No signs of joint swelling. No difficulty with gait.  Neurological:  Alert and oriented. Cranial nerves grossly intact. Coordination normal. +DTRs bilaterally. Psychiatric: Mood and affect normal. Behavior is normal. Judgment and thought content normal.    BMET    Component Value Date/Time   NA 137 06/08/2012 0805   K 3.6 06/08/2012 0805   CL 106 06/08/2012 0805   CO2 25 06/08/2012 0805   GLUCOSE 88 06/08/2012 0805   BUN 10 06/08/2012 0805   CREATININE 0.7 06/08/2012 0805   CALCIUM 9.7 06/08/2012 0805   GFRNONAA >60 02/25/2009 2345   GFRAA  Value: >60        The eGFR has been calculated using the MDRD equation. This calculation has not been validated in all clinical situations. eGFR's persistently <60 mL/min signify possible Chronic Kidney Disease. 02/25/2009 2345    Lipid Panel     Component Value Date/Time   CHOL 195 06/08/2012 0805   TRIG 103.0 06/08/2012 0805   HDL 45.60 06/08/2012 0805   CHOLHDL 4 06/08/2012 0805   VLDL 20.6 06/08/2012 0805   LDLCALC 129* 06/08/2012 0805    CBC    Component Value Date/Time   WBC 6.8 06/08/2012 0805   RBC 4.51 06/08/2012 0805   HGB 12.8 06/08/2012 0805   HCT 37.5 06/08/2012 0805   PLT 407.0* 06/08/2012 0805   MCV 83.2 06/08/2012 0805   MCHC 34.0 06/08/2012 0805   RDW 13.1 06/08/2012 0805   LYMPHSABS 1.4 06/08/2012 0805   MONOABS 0.5 06/08/2012 0805   EOSABS 0.3 06/08/2012 0805   BASOSABS 0.0 06/08/2012 0805    Hgb A1C No results found for this basename: HGBA1C          Assessment & Plan:   Prevent Health Maintenance:  All HM UTD Will repeat pap in 2017 Will obtain basic screening labs today  RTC in 1 year or sooner if needed

## 2013-06-11 NOTE — Progress Notes (Signed)
Pre visit review using our clinic review tool, if applicable. No additional management support is needed unless otherwise documented below in the visit note. 

## 2013-08-15 ENCOUNTER — Encounter: Payer: BC Managed Care – PPO | Admitting: Family Medicine

## 2014-08-06 ENCOUNTER — Ambulatory Visit (INDEPENDENT_AMBULATORY_CARE_PROVIDER_SITE_OTHER): Payer: BLUE CROSS/BLUE SHIELD | Admitting: Family Medicine

## 2014-08-06 ENCOUNTER — Encounter: Payer: Self-pay | Admitting: Family Medicine

## 2014-08-06 VITALS — BP 104/60 | HR 75 | Temp 97.3°F | Wt 173.2 lb

## 2014-08-06 DIAGNOSIS — R21 Rash and other nonspecific skin eruption: Secondary | ICD-10-CM | POA: Diagnosis not present

## 2014-08-06 MED ORDER — NYSTATIN-TRIAMCINOLONE 100000-0.1 UNIT/GM-% EX OINT: 1.0000 "application " | TOPICAL_OINTMENT | Freq: Two times a day (BID) | CUTANEOUS | Status: DC

## 2014-08-06 NOTE — Patient Instructions (Signed)
Try mycolog to area twice daily 7 days. Call me in 2 days with an update, sooner if it gets worse.

## 2014-08-06 NOTE — Progress Notes (Signed)
Pre visit review using our clinic review tool, if applicable. No additional management support is needed unless otherwise documented below in the visit note. 

## 2014-08-06 NOTE — Progress Notes (Signed)
  Subjective:     Renee Bean is a 34 y.o. female who complains of a rash. Symptoms began 3 days ago. Patient describes the rash as pigmented, localized. Characteristics of rash and associated history: Similar rash in the past? No.  No recent travel.  Rash is itchy but not painful.  No new foods or rxs.  Was at her sister's house but not sure she came in contact with anything different.  Tried topical benadryl but it seemed to make the rash worse.  Patient's previous dermatologic history includes acne. Medications currently using: topical antihistamine. Environmental exposures or allergies: none  The following portions of the patient's history were reviewed and updated as appropriate: allergies, current medications, past family history, past medical history, past social history, past surgical history and problem list.   Review of Systems Pertinent items are noted in HPI.    Objective:    BP 104/60 mmHg  Pulse 75  Temp(Src) 97.3 F (36.3 C) (Oral)  Wt 173 lb 4 oz (78.586 kg)  SpO2 99%  LMP 07/22/2014 Physical Exam  General:  alert, cooperative and appears stated age  HEENT:  extra ocular movement intact  Lymph Nodes:  Cervical, supraclavicular, and axillary nodes normal.  Lungs:  normal percussion bilaterally  Heart:  regular rate and rhythm, S1, S2 normal, no murmur, click, rub or gallop  Extremities:  extremities normal, atraumatic, no cyanosis or edema  Skin:   Rash located on the right intercubital fossa   Shape:   oval   Consistency:   nontender  Color:   salmon colored  Type: patch(es) - arm(s) right  Size: 5 cm     The remainder of the skin exam:  color normal, vascularity normal, no rashes or suspicious lesions, no evidence of bleeding or bruising     Assessment:    contact dermatitis: unknown vs ring worm    Plan:    1.  mycolog twice daily x 7 days 2.  Written patient instruction given. 3. Follow up in 2 days

## 2014-08-13 ENCOUNTER — Telehealth: Payer: Self-pay | Admitting: Family Medicine

## 2014-08-13 DIAGNOSIS — R21 Rash and other nonspecific skin eruption: Secondary | ICD-10-CM

## 2014-08-13 NOTE — Telephone Encounter (Signed)
Spoke to pt and informed her of instruction. Pt advised to await a call with appt details

## 2014-08-13 NOTE — Telephone Encounter (Signed)
I'm sorry to hear that.  At this point, dermatology referral is appropriate.  Referral placed.

## 2014-08-13 NOTE — Telephone Encounter (Signed)
Patient expressed that she has been using the cream that was prescribed for her rash but the rash is not getting any better.  Please advise.

## 2014-09-03 ENCOUNTER — Emergency Department (HOSPITAL_COMMUNITY)
Admission: EM | Admit: 2014-09-03 | Discharge: 2014-09-03 | Disposition: A | Discharge status: Home / Self Care | Payer: BLUE CROSS/BLUE SHIELD | Attending: Emergency Medicine | Admitting: Emergency Medicine

## 2014-09-03 ENCOUNTER — Encounter (HOSPITAL_COMMUNITY): Payer: Self-pay | Admitting: *Deleted

## 2014-09-03 ENCOUNTER — Emergency Department (HOSPITAL_COMMUNITY): Payer: BLUE CROSS/BLUE SHIELD

## 2014-09-03 DIAGNOSIS — Z3202 Encounter for pregnancy test, result negative: Secondary | ICD-10-CM | POA: Diagnosis not present

## 2014-09-03 DIAGNOSIS — Y92410 Unspecified street and highway as the place of occurrence of the external cause: Secondary | ICD-10-CM | POA: Diagnosis not present

## 2014-09-03 DIAGNOSIS — Z79899 Other long term (current) drug therapy: Secondary | ICD-10-CM | POA: Insufficient documentation

## 2014-09-03 DIAGNOSIS — S301XXA Contusion of abdominal wall, initial encounter: Secondary | ICD-10-CM

## 2014-09-03 DIAGNOSIS — S3991XA Unspecified injury of abdomen, initial encounter: Secondary | ICD-10-CM | POA: Insufficient documentation

## 2014-09-03 DIAGNOSIS — Y9389 Activity, other specified: Secondary | ICD-10-CM | POA: Insufficient documentation

## 2014-09-03 DIAGNOSIS — Y998 Other external cause status: Secondary | ICD-10-CM | POA: Insufficient documentation

## 2014-09-03 LAB — BASIC METABOLIC PANEL
ANION GAP: 8 (ref 5–15)
BUN: 9 mg/dL (ref 6–20)
CHLORIDE: 106 mmol/L (ref 101–111)
CO2: 24 mmol/L (ref 22–32)
Calcium: 9.6 mg/dL (ref 8.9–10.3)
Creatinine, Ser: 0.74 mg/dL (ref 0.44–1.00)
Glucose, Bld: 104 mg/dL — ABNORMAL HIGH (ref 65–99)
POTASSIUM: 3.4 mmol/L — AB (ref 3.5–5.1)
SODIUM: 138 mmol/L (ref 135–145)

## 2014-09-03 LAB — CBC
HCT: 36.2 % (ref 36.0–46.0)
HEMOGLOBIN: 12.1 g/dL (ref 12.0–15.0)
MCH: 28 pg (ref 26.0–34.0)
MCHC: 33.4 g/dL (ref 30.0–36.0)
MCV: 83.8 fL (ref 78.0–100.0)
PLATELETS: 486 10*3/uL — AB (ref 150–400)
RBC: 4.32 MIL/uL (ref 3.87–5.11)
RDW: 13.3 % (ref 11.5–15.5)
WBC: 13.9 10*3/uL — AB (ref 4.0–10.5)

## 2014-09-03 LAB — I-STAT BETA HCG BLOOD, ED (MC, WL, AP ONLY)

## 2014-09-03 MED ORDER — HYDROCODONE-ACETAMINOPHEN 5-325 MG PO TABS: 1.0000 | ORAL_TABLET | Freq: Four times a day (QID) | ORAL | Status: DC | PRN

## 2014-09-03 MED ORDER — IOHEXOL 300 MG/ML  SOLN
80.0000 mL | Freq: Once | INTRAMUSCULAR | Status: AC | PRN
  Administered 2014-09-03: 100 mL via INTRAVENOUS
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
## 2014-09-03 NOTE — Discharge Instructions (Signed)
Contusion °A contusion is a deep bruise. Contusions happen when an injury causes bleeding under the skin. Signs of bruising include pain, puffiness (swelling), and discolored skin. The contusion may turn blue, purple, or yellow. °HOME CARE  °· Put ice on the injured area. °¨ Put ice in a plastic bag. °¨ Place a towel between your skin and the bag. °¨ Leave the ice on for 15-20 minutes, 03-04 times a day. °· Only take medicine as told by your doctor. °· Rest the injured area. °· If possible, raise (elevate) the injured area to lessen puffiness. °GET HELP RIGHT AWAY IF:  °· You have more bruising or puffiness. °· You have pain that is getting worse. °· Your puffiness or pain is not helped by medicine. °MAKE SURE YOU:  °· Understand these instructions. °· Will watch your condition. °· Will get help right away if you are not doing well or get worse. °Document Released: 06/16/2007 Document Revised: 03/22/2011 Document Reviewed: 11/02/2010 °[employer]® Patient Information ©2015 [employer]. This information is not intended to replace advice given to you by your health care provider. Make sure you discuss any questions you have with your health care provider. ° °Cryotherapy °Cryotherapy means treatment with cold. Ice or gel packs can be used to reduce both pain and swelling. Ice is the most helpful within the first 24 to 48 hours after an injury or flare-up from overusing a muscle or joint. Sprains, strains, spasms, burning pain, shooting pain, and aches can all be eased with ice. Ice can also be used when recovering from surgery. Ice is effective, has very few side effects, and is safe for most people to use. °PRECAUTIONS  °Ice is not a safe treatment option for people with: °· Raynaud phenomenon. This is a condition affecting small blood vessels in the extremities. Exposure to cold may cause your problems to return. °· Cold hypersensitivity. There are many forms of cold hypersensitivity, including: °¨ Cold urticaria.  Red, itchy hives appear on the skin when the tissues begin to warm after being iced. °¨ Cold erythema. This is a red, itchy rash caused by exposure to cold. °¨ Cold hemoglobinuria. Red blood cells break down when the tissues begin to warm after being iced. The hemoglobin that carry oxygen are passed into the urine because they cannot combine with blood proteins fast enough. °· Numbness or altered sensitivity in the area being iced. °If you have any of the following conditions, do not use ice until you have discussed cryotherapy with your caregiver: °· Heart conditions, such as arrhythmia, angina, or chronic heart disease. °· High blood pressure. °· Healing wounds or open skin in the area being iced. °· Current infections. °· Rheumatoid arthritis. °· Poor circulation. °· Diabetes. °Ice slows the blood flow in the region it is applied. This is beneficial when trying to stop inflamed tissues from spreading irritating chemicals to surrounding tissues. However, if you expose your skin to cold temperatures for too long or without the proper protection, you can damage your skin or nerves. Watch for signs of skin damage due to cold. °HOME CARE INSTRUCTIONS °Follow these tips to use ice and cold packs safely. °· Place a dry or damp towel between the ice and skin. A damp towel will cool the skin more quickly, so you may need to shorten the time that the ice is used. °· For a more rapid response, add gentle compression to the ice. °· Ice for no more than 10 to 20 minutes at a time.   The bonier the area you are icing, the less time it will take to get the benefits of ice.  Check your skin after 5 minutes to make sure there are no signs of a poor response to cold or skin damage.  Rest 20 minutes or more between uses.  Once your skin is numb, you can end your treatment. You can test numbness by very lightly touching your skin. The touch should be so light that you do not see the skin dimple from the pressure of your  fingertip. When using ice, most people will feel these normal sensations in this order: cold, burning, aching, and numbness.  Do not use ice on someone who cannot communicate their responses to pain, such as small children or people with dementia. HOW TO MAKE AN ICE PACK Ice packs are the most common way to use ice therapy. Other methods include ice massage, ice baths, and cryosprays. Muscle creams that cause a cold, tingly feeling do not offer the same benefits that ice offers and should not be used as a substitute unless recommended by your caregiver. To make an ice pack, do one of the following:  Place crushed ice or a bag of frozen vegetables in a sealable plastic bag. Squeeze out the excess air. Place this bag inside another plastic bag. Slide the bag into a pillowcase or place a damp towel between your skin and the bag.  Mix 3 parts water with 1 part rubbing alcohol. Freeze the mixture in a sealable plastic bag. When you remove the mixture from the freezer, it will be slushy. Squeeze out the excess air. Place this bag inside another plastic bag. Slide the bag into a pillowcase or place a damp towel between your skin and the bag. SEEK MEDICAL CARE IF:  You develop white spots on your skin. This may give the skin a blotchy (mottled) appearance.  Your skin turns blue or pale.  Your skin becomes waxy or hard.  Your swelling gets worse. MAKE SURE YOU:   Understand these instructions.  Will watch your condition.  Will get help right away if you are not doing well or get worse. Document Released: 08/24/2010 Document Revised: 05/14/2013 Document Reviewed: 08/24/2010 [facility] Patient Information 2015 [place], [place]. This information is not intended to replace advice given to you by your health care provider. Make sure you discuss any questions you have with your health care provider. Your CT scan shows no intra-abdominal injuries.  You have a hematoma or bruising to your abdominal wall.  He  can apply an ice pack to this area 20 minutes on 2 hours off throughout the day after the first 48 hours.  She can use heat to help absorb the blood that has collected under the skin.  He been given a prescription for Vicodin for severe pain for the next one to 2 days after that, you can take ibuprofen or Tylenol
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
## 2019-09-06 ENCOUNTER — Ambulatory Visit: Payer: BLUE CROSS/BLUE SHIELD | Attending: Internal Medicine

## 2019-09-06 DIAGNOSIS — Z23 Encounter for immunization: Secondary | ICD-10-CM

## 2019-09-06 NOTE — Progress Notes (Signed)
   Covid-19 Vaccination Clinic  Name:  Renee Bean    MRN: [id number] DOB: 1980-07-05  09/06/2019  [name] was observed post Covid-19 immunization for 15 minutes without incident. She was provided with Vaccine Information Sheet and instruction to access the V-Safe system.   [name] was instructed to call 911 with any severe reactions post vaccine: Marland Kitchen Difficulty breathing  . Swelling of face and throat  . A fast heartbeat  . A bad rash all over body  . Dizziness and weakness   Immunizations Administered    Name Date Dose VIS Date Route   Pfizer COVID-19 Vaccine 09/06/2019  9:58 AM 0.3 mL 03/07/2018 Intramuscular   Manufacturer: [employer], [employer]   Lot: [id number]   NDC: [address]-5

## 2019-09-27 ENCOUNTER — Ambulatory Visit: Payer: BLUE CROSS/BLUE SHIELD | Attending: Internal Medicine

## 2019-09-27 DIAGNOSIS — Z23 Encounter for immunization: Secondary | ICD-10-CM

## 2019-09-27 NOTE — Progress Notes (Signed)
   Covid-19 Vaccination Clinic  Name:  Renee Bean    MRN: [id number] DOB: 03/02/1980  09/27/2019  [name] was observed post Covid-19 immunization for 15 minutes without incident. She was provided with Vaccine Information Sheet and instruction to access the V-Safe system.   [name] was instructed to call 911 with any severe reactions post vaccine: Marland Kitchen Difficulty breathing  . Swelling of face and throat  . A fast heartbeat  . A bad rash all over body  . Dizziness and weakness   Immunizations Administered    Name Date Dose VIS Date Route   Pfizer COVID-19 Vaccine 09/27/2019  9:49 AM 0.3 mL 03/07/2018 Intramuscular   Manufacturer: [employer], [employer]   Lot: [id number]   NDC: [id number]

## 2020-01-24 ENCOUNTER — Encounter: Payer: Self-pay | Admitting: Nurse Practitioner

## 2020-01-24 ENCOUNTER — Ambulatory Visit (INDEPENDENT_AMBULATORY_CARE_PROVIDER_SITE_OTHER): Payer: 59 | Admitting: Nurse Practitioner

## 2020-01-24 ENCOUNTER — Other Ambulatory Visit: Payer: Self-pay

## 2020-01-24 DIAGNOSIS — Z23 Encounter for immunization: Secondary | ICD-10-CM | POA: Diagnosis not present

## 2020-01-24 DIAGNOSIS — Z139 Encounter for screening, unspecified: Secondary | ICD-10-CM | POA: Diagnosis not present

## 2020-01-24 DIAGNOSIS — Z7689 Persons encountering health services in other specified circumstances: Secondary | ICD-10-CM | POA: Diagnosis not present

## 2020-01-24 NOTE — Assessment & Plan Note (Signed)
-  no recent PCP or labs/physical -will get physical in 2 weeks

## 2020-01-24 NOTE — Assessment & Plan Note (Signed)
-  will schedule for PAP with physical -will get HCV and HIV screening with next set of labs

## 2020-01-24 NOTE — Patient Instructions (Signed)
It was great meeting you today.  We will meet back up for a physical with PAP in 2 weeks. Please get fasting blood work 2-3 days prior to this appointment.

## 2020-01-24 NOTE — Progress Notes (Signed)
New Patient Office Visit  Subjective:  Patient ID: Renee Bean, female    DOB: 02/18/1980  Age: 40 y.o. MRN: [id number]  CC:  Chief Complaint  Patient presents with  . New Patient (Initial Visit)    HPI Renee Bean presents for new patient visit. Transferring care from [place] labs was over a year ago. Last physical was over a year ago.  She has concerns with shortness of breath with exertion.  No acute issues today.  She states she noticed this while she was doing vigorous cleaning.   History reviewed. No pertinent past medical history.  History reviewed. No pertinent surgical history.  Family History  Problem Relation Age of Onset  . Cancer Mother 62       colon  . Hypertension Mother   . Hypertension Father   . Hypertension Maternal Grandmother   . Diabetes Maternal Grandmother   . Hypertension Maternal Grandfather   . Diabetes Maternal Grandfather   . Hypertension Paternal Grandmother   . Diabetes Paternal Grandmother   . Hypertension Paternal Grandfather   . Diabetes Paternal Grandfather     Social History   Socioeconomic History  . Marital status: Single    Spouse name: Not on file  . Number of children: Not on file  . Years of education: Not on file  . Highest education level: Not on file  Occupational History  . 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 Smoking status: Never Smoker  . Smokeless tobacco: Never Used  Substance and Sexual Activity  . Alcohol use: No  . Drug use: No  . Sexual activity: Not Currently  Other Topics Concern  . Not on file  Social History Narrative  . Not on file   Social Determinants of Health   Financial Resource Strain: Not on file  Food Insecurity: Not on file  Transportation Needs: Not on file  Physical Activity: Not on file  Stress: Not on file  Social Connections: Not on file  Intimate Partner Violence: Not on file    ROS Review of Systems  Objective:    Today's Vitals: BP 129/80   Pulse 98   Temp 98.7 F (37.1 C)   Resp 20   Ht 5' 7"  (1.702 m)   Wt 188 lb (85.3 kg)   SpO2 99%   BMI 29.44 kg/m   Physical Exam  Assessment & Plan:   Problem List Items Addressed This Visit      Other   Encounter to establish care    -no recent PCP or labs/physical -will get physical in 2 weeks      Relevant Orders   CBC with Differential/Platelet   CMP14+EGFR   Lipid Panel With LDL/HDL Ratio   Screening due    -will schedule for PAP with physical -will get HCV and HIV screening with next set of labs      Relevant Orders   HCV Ab w/Rflx to Verification   HIV Antibody (routine testing w rflx)      Outpatient Encounter Medications as of 01/24/2020  Medication Sig  . [DISCONTINUED] HYDROcodone-acetaminophen (NORCO/VICODIN) 5-325 MG per tablet Take 1 tablet by mouth every 6 (six) hours as needed for moderate pain. (Patient not taking: Reported on 01/24/2020)  . [DISCONTINUED] nystatin-triamcinolone ointment (MYCOLOG) Apply 1 application topically 2 (two) times daily. (Patient not taking: Reported on 01/24/2020)   No facility-administered encounter medications on file as of 01/24/2020.    Follow-up: Return in about 2  weeks (around 02/07/2020) for Physical with PAP.   Noreene Larsson, NP

## 2020-01-24 NOTE — Assessment & Plan Note (Signed)
-  TDaP administered today 

## 2020-02-07 ENCOUNTER — Encounter: Payer: 59 | Admitting: Nurse Practitioner

## 2020-02-12 LAB — CMP14+EGFR
ALT: 13 IU/L (ref 0–32)
AST: 13 IU/L (ref 0–40)
Albumin/Globulin Ratio: 1.5 (ref 1.2–2.2)
Albumin: 4.5 g/dL (ref 3.8–4.8)
Alkaline Phosphatase: 80 IU/L (ref 44–121)
BUN/Creatinine Ratio: 14 (ref 9–23)
BUN: 11 mg/dL (ref 6–24)
Bilirubin Total: 0.3 mg/dL (ref 0.0–1.2)
CO2: 21 mmol/L (ref 20–29)
Calcium: 10.2 mg/dL (ref 8.7–10.2)
Chloride: 105 mmol/L (ref 96–106)
Creatinine, Ser: 0.76 mg/dL (ref 0.57–1.00)
GFR calc Af Amer: 113 mL/min/{1.73_m2} (ref >59)
GFR calc non Af Amer: 98 mL/min/{1.73_m2} (ref >59)
Globulin, Total: 3.1 g/dL (ref 1.5–4.5)
Glucose: 90 mg/dL (ref 65–99)
Potassium: 4.2 mmol/L (ref 3.5–5.2)
Sodium: 143 mmol/L (ref 134–144)
Total Protein: 7.6 g/dL (ref 6.0–8.5)

## 2020-02-12 LAB — CBC WITH DIFFERENTIAL/PLATELET
Basophils Absolute: 0 10*3/uL (ref 0.0–0.2)
Basos: 1 %
EOS (ABSOLUTE): 0.1 10*3/uL (ref 0.0–0.4)
Eos: 2 %
Hematocrit: 36.9 % (ref 34.0–46.6)
Hemoglobin: 12.1 g/dL (ref 11.1–15.9)
Immature Grans (Abs): 0 10*3/uL (ref 0.0–0.1)
Immature Granulocytes: 0 %
Lymphocytes Absolute: 1.8 10*3/uL (ref 0.7–3.1)
Lymphs: 28 %
MCH: 25.6 pg — ABNORMAL LOW (ref 26.6–33.0)
MCHC: 32.8 g/dL (ref 31.5–35.7)
MCV: 78 fL — ABNORMAL LOW (ref 79–97)
Monocytes Absolute: 0.5 10*3/uL (ref 0.1–0.9)
Monocytes: 7 %
Neutrophils Absolute: 4.1 10*3/uL (ref 1.4–7.0)
Neutrophils: 62 %
Platelets: 534 10*3/uL — ABNORMAL HIGH (ref 150–450)
RBC: 4.72 x10E6/uL (ref 3.77–5.28)
RDW: 12.8 % (ref 11.7–15.4)
WBC: 6.5 10*3/uL (ref 3.4–10.8)

## 2020-02-12 LAB — LIPID PANEL WITH LDL/HDL RATIO
Cholesterol, Total: 219 mg/dL — ABNORMAL HIGH (ref 100–199)
HDL: 44 mg/dL (ref >39)
LDL Chol Calc (NIH): 138 mg/dL — ABNORMAL HIGH (ref 0–99)
LDL/HDL Ratio: 3.1 ratio (ref 0.0–3.2)
Triglycerides: 207 mg/dL — ABNORMAL HIGH (ref 0–149)
VLDL Cholesterol Cal: 37 mg/dL (ref 5–40)

## 2020-02-12 LAB — HCV AB W/RFLX TO VERIFICATION: HCV Ab: <0.1 s/co ratio (ref 0.0–0.9)

## 2020-02-12 LAB — HCV INTERPRETATION

## 2020-02-12 LAB — HIV ANTIBODY (ROUTINE TESTING W REFLEX): HIV Screen 4th Generation wRfx: NONREACTIVE

## 2020-02-12 NOTE — Progress Notes (Signed)
Her LDL, or bad cholesterol, is elevated at 138. The high end of normal is 100, so we can discuss some strategies to reduce cholesterol at our next appointment in 2 days.

## 2020-02-14 ENCOUNTER — Other Ambulatory Visit: Payer: Self-pay

## 2020-02-14 ENCOUNTER — Ambulatory Visit (INDEPENDENT_AMBULATORY_CARE_PROVIDER_SITE_OTHER): Payer: 59 | Admitting: Nurse Practitioner

## 2020-02-14 ENCOUNTER — Encounter: Payer: Self-pay | Admitting: Nurse Practitioner

## 2020-02-14 DIAGNOSIS — Z124 Encounter for screening for malignant neoplasm of cervix: Secondary | ICD-10-CM | POA: Diagnosis not present

## 2020-02-14 DIAGNOSIS — E782 Mixed hyperlipidemia: Secondary | ICD-10-CM | POA: Diagnosis not present

## 2020-02-14 DIAGNOSIS — D75839 Thrombocytosis, unspecified: Secondary | ICD-10-CM | POA: Diagnosis not present

## 2020-02-14 DIAGNOSIS — E785 Hyperlipidemia, unspecified: Secondary | ICD-10-CM | POA: Insufficient documentation

## 2020-02-14 NOTE — Addendum Note (Signed)
Addended by: Dellia Cloud on: 02/14/2020 04:37 PM   Modules accepted: Orders

## 2020-02-14 NOTE — Assessment & Plan Note (Addendum)
Lab Results  Component Value Date   CHOL 219 (H) 02/11/2020   HDL 44 02/11/2020   LDLCALC 138 (H) 02/11/2020   TRIG 207 (H) 02/11/2020   CHOLHDL 4 06/11/2013   -discussed lifestyle changes and statin therapy -she would prefer to increase exercise and change her diet

## 2020-02-14 NOTE — Patient Instructions (Addendum)
It was great seeing you again today.  Your labs were great. We will recheck them again including your platelet count and cholesterol in 6 months.  I sent in a referral to [facility] for a well-woman exam with PAP to screen for cervical cancer.   Preventing High Cholesterol Cholesterol is a white, waxy substance similar to fat that the human body needs to help build cells. The liver makes all the cholesterol that a person's body needs. Having high cholesterol (hypercholesterolemia) increases your risk for heart disease and stroke. Extra or excess cholesterol comes from the food that you eat. High cholesterol can often be prevented with diet and lifestyle changes. If you already have high cholesterol, you can control it with diet, lifestyle changes, and medicines. How can high cholesterol affect me? If you have high cholesterol, fatty deposits (plaques) may build up on the walls of your blood vessels. The blood vessels that carry blood away from your heart are called arteries. Plaques make the arteries narrower and stiffer. This in turn can:  Restrict or block blood flow and cause blood clots to form.  Increase your risk for heart attack and stroke. What can increase my risk for high cholesterol? This condition is more likely to develop in people who:  Eat foods that are high in saturated fat or cholesterol. Saturated fat is mostly found in foods that come from animal sources.  Are overweight.  Are not getting enough exercise.  Have a family history of high cholesterol (familial hypercholesterolemia). What actions can I take to prevent this? Nutrition  Eat less saturated fat.  Avoid trans fats (partially hydrogenated oils). These are often found in margarine and in some baked goods, fried foods, and snacks bought in packages.  Avoid precooked or cured meat, such as bacon, sausages, or meat loaves.  Avoid foods and drinks that have added sugars.  Eat more fruits, vegetables, and  whole grains.  Choose healthy sources of protein, such as fish, poultry, lean cuts of red meat, beans, peas, lentils, and nuts.  Choose healthy sources of fat, such as: ? Nuts. ? Vegetable oils, especially olive oil. ? Fish that have healthy fats, such as omega-3 fatty acids. These fish include mackerel or salmon.   Lifestyle  Lose weight if you are overweight. Maintaining a healthy body mass index (BMI) can help prevent or control high cholesterol. It can also lower your risk for diabetes and high blood pressure. Ask your health care provider to help you with a diet and exercise plan to lose weight safely.  Do not use any products that contain nicotine or tobacco, such as cigarettes, e-cigarettes, and chewing tobacco. If you need help quitting, ask your health care provider. Alcohol use  Do not drink alcohol if: ? Your health care provider tells you not to drink. ? You are pregnant, may be pregnant, or are planning to become pregnant.  If you drink alcohol: ? Limit how much you use to:  0-1 drink a day for women.  0-2 drinks a day for men. ? Be aware of how much alcohol is in your drink. In the [place] one drink equals one 12 oz bottle of beer (355 mL), one 5 oz glass of wine (148 mL), or one 1 oz glass of hard liquor (44 mL). Activity  Get enough exercise. Do exercises as told by your health care provider.  Each week, do at least 150 minutes of exercise that takes a medium level of effort (moderate-intensity exercise). This kind of  exercise: ? Makes your heart beat faster while allowing you to still be able to talk. ? Can be done in short sessions several times a day or longer sessions a few times a week. For example, on 5 days each week, you could walk fast or ride your bike 3 times a day for 10 minutes each time.   Medicines  Your health care provider may recommend medicines to help lower cholesterol. This may be a medicine to lower the amount of cholesterol that your liver  makes. You may need medicine if: ? Diet and lifestyle changes have not lowered your cholesterol enough. ? You have high cholesterol and other risk factors for heart disease or stroke.  Take over-the-counter and prescription medicines only as told by your health care provider. General information  Manage your risk factors for high cholesterol. Talk with your health care provider about all your risk factors and how to lower your risk.  Manage other conditions that you have, such as diabetes or high blood pressure (hypertension).  Have blood tests to check your cholesterol levels at regular points in time as told by your health care provider.  Keep all follow-up visits as told by your health care provider. This is important. Where to find more information  American Heart Association: www.heart.org  National Heart, Lung, and Blood Institute: [employer].is Summary  High cholesterol increases your risk for heart disease and stroke. By keeping your cholesterol level low, you can reduce your risk for these conditions.  High cholesterol can often be prevented with diet and lifestyle changes.  Work with your health care provider to manage your risk factors, and have your blood tested regularly. This information is not intended to replace advice given to you by your health care provider. Make sure you discuss any questions you have with your health care provider. Document Revised: 10/10/2018 Document Reviewed: 10/10/2018 Elsevier Patient Education  2021 [employer].

## 2020-02-14 NOTE — Progress Notes (Signed)
Established Patient Office Visit  Subjective:  Patient ID: Renee Bean, female    DOB: July 19, 1980  Age: 40 y.o. MRN: [id number]  CC:  Chief Complaint  Patient presents with  . Annual Exam    HPI Renee Bean presents for physical exam. At her last OV, she was concerned with some mild SOB with exertion.  She states that she is more tired than she thinks she should be after doing strenuous activity. Additionally, she states that she had 2 panic attacks since her last visit.  She states she was asleep and tried to swallow, but she couldn't and that scared her. She states it has been several years since she had a panic attack.  History reviewed. No pertinent past medical history.  Past Surgical History:  Procedure Laterality Date  . NO PAST SURGERIES      Family History  Problem Relation Age of Onset  . Cancer Mother 43       colon  . Hypertension Mother   . Hypertension Father   . Hypertension Maternal Grandmother   . Diabetes Maternal Grandmother   . Hypertension Maternal Grandfather   . Diabetes Maternal Grandfather   . Hypertension Paternal Grandmother   . Diabetes Paternal Grandmother   . Hypertension Paternal Grandfather   . Diabetes Paternal Grandfather     Social History   Socioeconomic History  . Marital status: Single    Spouse name: Not on file  . Number of children: Not on file  . Years of education: Not on file  . Highest education level: Not on file  Occupational History  . 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 Smoking status: Never Smoker  . Smokeless tobacco: Never Used  Substance and Sexual Activity  . Alcohol use: No  . Drug use: No  . Sexual activity: Not Currently  Other Topics Concern  . Not on file  Social History Narrative  . Not on file   Social Determinants of Health   Financial Resource Strain: Not on file  Food Insecurity: Not on file  Transportation Needs: Not on file  Physical Activity: Not on  file  Stress: Not on file  Social Connections: Not on file  Intimate Partner Violence: Not on file    No outpatient medications prior to visit.   No facility-administered medications prior to visit.    No Known Allergies  ROS Review of Systems  Constitutional: Negative.   HENT: Negative.   Eyes: Negative.   Respiratory: Negative.   Cardiovascular: Negative.   Gastrointestinal: Negative.   Endocrine: Negative.   Genitourinary: Negative.   Musculoskeletal: Negative.   Skin: Negative.   Allergic/Immunologic: Negative.   Neurological: Negative.   Hematological: Negative.   Psychiatric/Behavioral: Negative.       Objective:    Physical Exam Constitutional:      Appearance: Normal appearance.  HENT:     Head: Normocephalic and atraumatic.     Right Ear: Tympanic membrane, ear canal and external ear normal.     Left Ear: Tympanic membrane, ear canal and external ear normal.     Nose: Nose normal.     Mouth/Throat:     Mouth: Mucous membranes are moist.     Pharynx: Oropharynx is clear.  Eyes:     Extraocular Movements: Extraocular movements intact.     Conjunctiva/sclera: Conjunctivae normal.     Pupils: Pupils are equal, round, and reactive to light.  Cardiovascular:     Rate and Rhythm: Normal  rate and regular rhythm.     Pulses: Normal pulses.     Heart sounds: Normal heart sounds.  Pulmonary:     Effort: Pulmonary effort is normal.     Breath sounds: Normal breath sounds.  Abdominal:     General: Abdomen is flat. Bowel sounds are normal.     Palpations: Abdomen is soft.  Genitourinary:    General: Normal vulva.     Rectum: Normal.  Musculoskeletal:        General: Normal range of motion.     Cervical back: Normal range of motion and neck supple.  Skin:    General: Skin is warm and dry.     Capillary Refill: Capillary refill takes less than 2 seconds.  Neurological:     General: No focal deficit present.     Mental Status: She is alert and oriented to  person, place, and time.     Cranial Nerves: No cranial nerve deficit.     Sensory: No sensory deficit.     Motor: No weakness.     Coordination: Coordination normal.     Gait: Gait normal.  Psychiatric:        Mood and Affect: Mood normal.        Behavior: Behavior normal.        Thought Content: Thought content normal.        Judgment: Judgment normal.     BP 134/85   Pulse (!) 116   Temp 97.7 F (36.5 C)   Resp 20   Ht 5\' 7"  (1.702 m)   Wt 189 lb (85.7 kg)   SpO2 97%   BMI 29.60 kg/m  Wt Readings from Last 3 Encounters:  02/14/20 189 lb (85.7 kg)  01/24/20 188 lb (85.3 kg)  08/06/14 173 lb 4 oz (78.6 kg)     Health Maintenance Due  Topic Date Due  . PAP SMEAR-Modifier  06/14/2015    There are no preventive care reminders to display for this patient.  Lab Results  Component Value Date   TSH 1.16 06/11/2013   Lab Results  Component Value Date   WBC 6.5 02/11/2020   HGB 12.1 02/11/2020   HCT 36.9 02/11/2020   MCV 78 (L) 02/11/2020   PLT 534 (H) 02/11/2020   Lab Results  Component Value Date   NA 143 02/11/2020   K 4.2 02/11/2020   CO2 21 02/11/2020   GLUCOSE 90 02/11/2020   BUN 11 02/11/2020   CREATININE 0.76 02/11/2020   BILITOT 0.3 02/11/2020   ALKPHOS 80 02/11/2020   AST 13 02/11/2020   ALT 13 02/11/2020   PROT 7.6 02/11/2020   ALBUMIN 4.5 02/11/2020   CALCIUM 10.2 02/11/2020   ANIONGAP 8 09/03/2014   GFR 110.16 06/11/2013   Lab Results  Component Value Date   CHOL 219 (H) 02/11/2020   Lab Results  Component Value Date   HDL 44 02/11/2020   Lab Results  Component Value Date   LDLCALC 138 (H) 02/11/2020   Lab Results  Component Value Date   TRIG 207 (H) 02/11/2020   Lab Results  Component Value Date   CHOLHDL 4 06/11/2013   No results found for: HGBA1C    Assessment & Plan:   Problem List Items Addressed This Visit      Hematopoietic and Hemostatic   Thrombocytosis    Lab Results  Component Value Date   PLT 534 (H)  02/11/2020   -recheck in 6 months -is higher today than previous  lab draws -if trending up at next visit, will consider hematology consult -MCV and MCH count are low today, but RBC and WBC are WNL        Other   Pap smear for cervical cancer screening    -PAP attempted today -unable to get a specimen; referred to GYN      Relevant Orders   Ambulatory referral to Gynecology   Hyperlipidemia, mixed    Lab Results  Component Value Date   CHOL 219 (H) 02/11/2020   HDL 44 02/11/2020   LDLCALC 138 (H) 02/11/2020   TRIG 207 (H) 02/11/2020   CHOLHDL 4 06/11/2013   -discussed lifestyle changes and statin therapy -she would prefer to increase exercise and change her diet         No orders of the defined types were placed in this encounter.   Follow-up: Return in about 6 months (around 08/13/2020) for Lab follow-up.    Heather Roberts, NP

## 2020-02-14 NOTE — Assessment & Plan Note (Addendum)
Lab Results  Component Value Date   PLT 534 (H) 02/11/2020   -recheck in 6 months -is higher today than previous lab draws -if trending up at next visit, will consider hematology consult -MCV and MCH count are low today, but RBC and WBC are WNL

## 2020-02-14 NOTE — Assessment & Plan Note (Addendum)
-  PAP attempted today -unable to get a specimen; referred to GYN

## 2020-03-21 ENCOUNTER — Ambulatory Visit: Payer: 59 | Admitting: Obstetrics & Gynecology

## 2020-03-21 ENCOUNTER — Other Ambulatory Visit: Payer: Self-pay

## 2020-03-21 ENCOUNTER — Encounter: Payer: Self-pay | Admitting: Obstetrics & Gynecology

## 2020-03-21 ENCOUNTER — Other Ambulatory Visit (HOSPITAL_COMMUNITY)
Admission: RE | Admit: 2020-03-21 | Discharge: 2020-03-21 | Disposition: A | Discharge status: Home / Self Care | Payer: 59 | Source: Ambulatory Visit | Attending: Obstetrics & Gynecology | Admitting: Obstetrics & Gynecology

## 2020-03-21 VITALS — BP 124/85 | HR 104 | Ht 67.0 in | Wt 185.0 lb

## 2020-03-21 DIAGNOSIS — Z01419 Encounter for gynecological examination (general) (routine) without abnormal findings: Secondary | ICD-10-CM | POA: Insufficient documentation

## 2020-03-21 DIAGNOSIS — Z800 Family history of malignant neoplasm of digestive organs: Secondary | ICD-10-CM

## 2020-03-21 DIAGNOSIS — Z1231 Encounter for screening mammogram for malignant neoplasm of breast: Secondary | ICD-10-CM

## 2020-03-21 NOTE — Progress Notes (Signed)
   WELL-WOMAN EXAMINATION Patient name: Renee Bean MRN [id number]  Date of birth: 1980-01-15 Chief Complaint:   Pap only  History of Present Illness:   Renee Bean is a 40 y.o. G0P0000 Caucasian female being seen today for a routine well-woman exam. Family medicine was unable to complete pap smear- other well woman components already completed.  Current complaints: none  Depression screen [facility] 2/9 03/21/2020 02/14/2020 01/24/2020 06/11/2013  Decreased Interest 0 0 0 0  Down, Depressed, Hopeless 0 0 0 0  PHQ - 2 Score 0 0 0 0  Altered sleeping 0 - - -  Tired, decreased energy 0 - - -  Change in appetite 0 - - -  Feeling bad or failure about yourself  0 - - -  Trouble concentrating 0 - - -  Moving slowly or fidgety/restless 0 - - -  Suicidal thoughts 0 - - -  PHQ-9 Score 0 - - -     PCP: Dr. Wallace Cullens      does not desire labs Patient's last menstrual period was 03/01/2020. The current method of family planning is declined.   Last mammogram: to be scheduled Last colonoscopy: none. Results were: N/A. Family h/o colorectal cancer: yes mother @ 48yo Review of Systems:   Pertinent items are noted in HPI Denies any headaches, blurred vision, fatigue, shortness of breath, chest pain, abdominal pain, abnormal vaginal discharge/itching/odor/irritation, problems with periods, bowel movements, urination, or intercourse unless otherwise stated above. Pertinent History Reviewed:  Reviewed past medical,surgical, social and family history.  Reviewed problem list, medications and allergies. Physical Assessment:   Vitals:   03/21/20 1101  BP: 124/85  Pulse: (!) 104  Weight: 185 lb (83.9 kg)  Height: 5\' 7"  (1.702 m)  Body mass index is 28.98 kg/m.        Physical Examination:   General appearance - well appearing, and in no distress  Mental status - alert, oriented to person, place, and time  Psych:  She has a normal mood and affect  Skin - warm and dry, normal color, no suspicious  lesions noted  Chest - normal respiratory rate and effort  Neck:  midline trachea, no thyromegaly or nodules  Breasts - deferred  Abdomen - soft, nontender, nondistended, no masses or organomegaly  Pelvic - VULVA: normal appearing vulva with no masses, tenderness or lesions  VAGINA: normal appearing vagina with normal color and discharge, no lesions  CERVIX: normal appearing cervix without discharge or lesions, no CMT  Thin prep pap is done with HR HPV cotesting  UTERUS: uterus is felt to be normal size, shape, consistency and nontender   ADNEXA: No adnexal masses or tenderness noted.  Extremities:  No swelling or varicosities noted  Chaperone: Peggy Dones     Assessment & Plan:  1) Well-Woman Exam Pap collected Encouraged to complete mammogram  2) Family h/o colon cancer- pt has tried to get colonoscopy in past, but insurance would not cover.  Encouraged pt to reach out again to insurance to see if that has changed  Labs/procedures today: Pap  Meds: No orders of the defined types were placed in this encounter.   Follow-up: Return in about 1 year (around 03/21/2021) for Annual.  05/21/2021, DO Attending Obstetrician & Gynecologist, [facility] for [facility], [facility]

## 2020-03-28 LAB — CYTOLOGY - PAP
Comment: NEGATIVE
Comment: NEGATIVE
HPV 16: NEGATIVE
HPV 18 / 45: NEGATIVE
High risk HPV: POSITIVE — AB

## 2020-03-31 ENCOUNTER — Encounter: Payer: Self-pay | Admitting: *Deleted

## 2020-04-21 ENCOUNTER — Encounter: Payer: 59 | Admitting: Obstetrics & Gynecology

## 2020-05-14 ENCOUNTER — Ambulatory Visit (INDEPENDENT_AMBULATORY_CARE_PROVIDER_SITE_OTHER): Payer: 59 | Admitting: Obstetrics & Gynecology

## 2020-05-14 ENCOUNTER — Other Ambulatory Visit (HOSPITAL_COMMUNITY)
Admission: RE | Admit: 2020-05-14 | Discharge: 2020-05-14 | Disposition: A | Discharge status: Home / Self Care | Payer: 59 | Source: Ambulatory Visit | Attending: Obstetrics & Gynecology | Admitting: Obstetrics & Gynecology

## 2020-05-14 ENCOUNTER — Encounter: Payer: Self-pay | Admitting: Obstetrics & Gynecology

## 2020-05-14 ENCOUNTER — Other Ambulatory Visit: Payer: Self-pay

## 2020-05-14 VITALS — BP 127/80 | HR 124 | Ht 67.0 in | Wt 180.2 lb

## 2020-05-14 DIAGNOSIS — R87612 Low grade squamous intraepithelial lesion on cytologic smear of cervix (LGSIL): Secondary | ICD-10-CM | POA: Insufficient documentation

## 2020-05-14 DIAGNOSIS — B977 Papillomavirus as the cause of diseases classified elsewhere: Secondary | ICD-10-CM

## 2020-05-14 DIAGNOSIS — Z3202 Encounter for pregnancy test, result negative: Secondary | ICD-10-CM

## 2020-05-14 LAB — POCT URINE PREGNANCY: Preg Test, Ur: NEGATIVE

## 2020-05-14 NOTE — Progress Notes (Signed)
    Colposcopy Procedure Note:   Colposcopy Procedure Note  Indications: Recent Pap: 03/21/2020- LSIL, HPV+   Smoker:  No. Same partner for the past 6 yrs.  Not currently sexually active  History of abnormal Pap: no  Denies irregular bleeding.  Denies abnormal discharge, itching or irritation.  No acute complaints  O: BP 127/80 (BP Location: Left Arm, Patient Position: Sitting, Cuff Size: Normal)   Pulse (!) 124   Ht 5\' 7"  (1.702 m)   Wt 180 lb 3.2 oz (81.7 kg)   LMP 04/29/2020   BMI 28.22 kg/m   General WDWN female NAD Pscyh: mood appropriate Resp: normal respiratory effort Abd: soft and non-tender, no rebound, no guarding Vulva:  normal appearing vulva with no masses, tenderness or lesions Vagina:  normal mucosa, no discharge Cervix:  See procedure below Ext: no edema  Procedure Details  The risks and benefits of the procedure and Written informed consent obtained.  Speculum placed in vagina and excellent visualization of cervix achieved, cervix swabbed x 3 with acetic acid solution.  Findings: Adequate colposcopy is noted today.  Cervix: acetowhite lesion(s) noted with abnormal vascularity seen at 4 o'clock; endocervical curettage performed and cervical biopsies obtained  Specimens: endocervical and cervical biopsies  Complications: none.  Colposcopic Impression: CIN 1   Plan(Based on 2019 ASCCP recommendations)  Counseled patient regarding cervical dysplasia- abnormal pap smears and HPV.  Reviewed plan for colposcopy today, discussed potential results.  Specimens labelled and sent to Pathology.  Further management pending today's results

## 2020-05-19 LAB — SURGICAL PATHOLOGY

## 2020-05-20 ENCOUNTER — Encounter: Payer: Self-pay | Admitting: Nurse Practitioner

## 2020-05-21 ENCOUNTER — Other Ambulatory Visit: Payer: Self-pay | Admitting: Nurse Practitioner

## 2020-05-21 DIAGNOSIS — R0600 Dyspnea, unspecified: Secondary | ICD-10-CM

## 2020-05-21 DIAGNOSIS — R0609 Other forms of dyspnea: Secondary | ICD-10-CM

## 2020-07-03 NOTE — Progress Notes (Signed)
CARDIOLOGY CONSULT NOTE       Patient ID: Renee Bean MRN: [id number] DOB/AGE: 42/22/1982 42 y.o.  Admit date: (Not on file) Referring Physician: Wallace Bean Primary Physician: Renee Roberts, NP Primary Cardiologist: New Reason for Consultation: Dyspnea  Active Problems:   * No active hospital problems. *   HPI:  40 y.o. referred by Renee Pippin NP for exertional dyspnea. Reviewed office note from 02/14/40 Patient indicates she feels more fatigued than she should be after strenuous exercise Has panic attacks These had been quiescent till recently  She has history thrombocytosis. Non smoker No chronic lung disease No previous heart issues  LDL is 138  patient prefers diet lifestyle Rx not statin Lab review shows no anemia Hct 36.9 No recent TSH  She has noted some rapid heart beating at rest. ECG in office today ST rate 116 Has a miniature pinscher and chihuahua at home she can walk without difficulty Doing Door Renee Bean now Likes to 4 wheel and watch movies at Renee Bean alone but has lots of family nearby   ROS All other systems reviewed and negative except as noted above  No past medical history on file.  Family History  Problem Relation Age of Onset   Cancer Mother 31       colon   Hypertension Mother    Hypertension Father    Diabetes Father    High Cholesterol Father    Hypertension Maternal Grandmother    Diabetes Maternal Grandmother    Hypertension Maternal Grandfather    Diabetes Maternal Grandfather    Hypertension Paternal Grandmother    Diabetes Paternal Grandmother    Hypertension Paternal Grandfather    Diabetes Paternal Grandfather    High Cholesterol Sister     Social History   Socioeconomic History   Marital status: Single    Spouse name: Not on file   Number of children: Not on file   Years of education: Not on file   Highest education level: Not on file  Occupational History   Occupation: Renee Bean    Comment: deliver  Tobacco Use   Smoking  status: Never   Smokeless tobacco: Never  Vaping Use   Vaping Use: Never used  Substance and Sexual Activity   Alcohol use: No   Drug use: No   Sexual activity: Not Currently    Birth control/protection: None  Other Topics Concern   Not on file  Social History Narrative   Not on file   Social Determinants of Health   Financial Resource Strain: Low Risk    Difficulty of Paying Living Expenses: Not hard at all  Food Insecurity: No Food Insecurity   Worried About Programme researcher, broadcasting/film/video in the Last Year: Never true   Ran Out of Food in the Last Year: Never true  Transportation Needs: No Transportation Needs   Lack of Transportation (Medical): No   Lack of Transportation (Non-Medical): No  Physical Activity: Sufficiently Active   Days of Exercise per Week: 5 days   Minutes of Exercise per Session: 30 min  Stress: Stress Concern Present   Feeling of Stress : To some extent  Social Connections: Moderately Integrated   Frequency of Communication with Friends and Family: More than three times a week   Frequency of Social Gatherings with Friends and Family: Three times a week   Attends Religious Services: More than 4 times per year   Active Member of Clubs or Organizations: Yes   Attends Banker Meetings: More than  4 times per year   Marital Status: Never married  Intimate Partner Violence: Not At Risk   Fear of Current or Ex-Partner: No   Emotionally Abused: No   Physically Abused: No   Sexually Abused: No    Past Surgical History:  Procedure Laterality Date   NO PAST SURGERIES       No current outpatient medications on file.    Physical Exam: There were no vitals taken for this visit.    Affect appropriate Healthy:  appears stated age HEENT: normal Neck supple with no adenopathy JVP normal no bruits no thyromegaly Lungs clear with no wheezing and good diaphragmatic motion Heart:  S1/S2 no murmur, no rub, gallop or click PMI normal Abdomen: benighn, BS  positve, no tenderness, no AAA no bruit.  No HSM or HJR Distal pulses intact with no bruits No edema Neuro non-focal Skin warm and dry No muscular weakness   Labs:   Lab Results  Component Value Date   WBC 6.5 02/11/2020   HGB 12.1 02/11/2020   HCT 36.9 02/11/2020   MCV 78 (L) 02/11/2020   PLT 534 (H) 02/11/2020   No results for input(s): NA, K, CL, CO2, BUN, CREATININE, CALCIUM, PROT, BILITOT, ALKPHOS, ALT, AST, GLUCOSE in the last 168 hours.  Invalid input(s): LABALBU No results found for: CKTOTAL, CKMB, CKMBINDEX, TROPONINI  Lab Results  Component Value Date   CHOL 219 (H) 02/11/2020   CHOL 183 06/11/2013   CHOL 195 06/08/2012   Lab Results  Component Value Date   HDL 44 02/11/2020   HDL 41.60 06/11/2013   HDL 45.60 06/08/2012   Lab Results  Component Value Date   LDLCALC 138 (H) 02/11/2020   LDLCALC 124 (H) 06/11/2013   LDLCALC 129 (H) 06/08/2012   Lab Results  Component Value Date   TRIG 207 (H) 02/11/2020   TRIG 89.0 06/11/2013   TRIG 103.0 06/08/2012   Lab Results  Component Value Date   CHOLHDL 4 06/11/2013   CHOLHDL 4 06/08/2012   CHOLHDL 4 02/23/2010   No results found for: LDLDIRECT    Radiology: No results found.  EKG: ST rate 116 LAE nonspecific ST changes    ASSESSMENT AND PLAN:   Dyspnea;  seems functional needs TSH/T4 f/u echo to assess LV/RV function consider cardiopulmonary stress test pending results   Panic Attacks:  f/u primary consider anxiolytic anti depressant HLD:  LDL 138 f/u primary can consider calcium score in future  Tachycardia. ECG with HR 116 at rest see above regarding thyroid and echo will get 48 hour holter to make sure she has normal nocturnal slowing   Echo TSH/T4 48 hours Holter   F/U  PRN pending testing   Signed: Charlton Bean 07/03/2020, 7:27 AM 7:27 AM

## 2020-07-15 ENCOUNTER — Ambulatory Visit (INDEPENDENT_AMBULATORY_CARE_PROVIDER_SITE_OTHER): Payer: 59

## 2020-07-15 ENCOUNTER — Other Ambulatory Visit (HOSPITAL_COMMUNITY)
Admission: RE | Admit: 2020-07-15 | Discharge: 2020-07-15 | Disposition: A | Discharge status: Home / Self Care | Payer: 59 | Source: Ambulatory Visit | Attending: Cardiovascular Disease | Admitting: Cardiovascular Disease

## 2020-07-15 ENCOUNTER — Other Ambulatory Visit: Payer: Self-pay

## 2020-07-15 ENCOUNTER — Ambulatory Visit (INDEPENDENT_AMBULATORY_CARE_PROVIDER_SITE_OTHER): Payer: 59 | Admitting: Cardiovascular Disease

## 2020-07-15 VITALS — BP 136/82 | HR 116 | Ht 67.0 in | Wt 174.8 lb

## 2020-07-15 DIAGNOSIS — R002 Palpitations: Secondary | ICD-10-CM | POA: Insufficient documentation

## 2020-07-15 DIAGNOSIS — F410 Panic disorder [episodic paroxysmal anxiety] without agoraphobia: Secondary | ICD-10-CM | POA: Diagnosis not present

## 2020-07-15 DIAGNOSIS — R0600 Dyspnea, unspecified: Secondary | ICD-10-CM | POA: Diagnosis not present

## 2020-07-15 DIAGNOSIS — R000 Tachycardia, unspecified: Secondary | ICD-10-CM | POA: Diagnosis not present

## 2020-07-15 LAB — TSH: TSH: 1.291 u[IU]/mL (ref 0.350–4.500)

## 2020-07-15 LAB — T4, FREE: Free T4: 1.06 ng/dL (ref 0.61–1.12)

## 2020-07-15 NOTE — Patient Instructions (Signed)
Medication Instructions:  *If you need a refill on your cardiac medications before your next appointment, please call your pharmacy*   Lab Work: Your physician recommends that you return for lab work in: TSH/T4  If you have labs (blood work) drawn today and your tests are completely normal, you will receive your results only by: MyChart Message (if you have MyChart) OR A paper copy in the mail If you have any lab test that is abnormal or we need to change your treatment, we will call you to review the results.  Testing/Procedures: Your physician has requested that you have an echocardiogram. Echocardiography is a painless test that uses sound waves to create images of your heart. It provides your doctor with information about the size and shape of your heart and how well your heart's chambers and valves are working. This procedure takes approximately one hour. There are no restrictions for this procedure.  ZIO XT- Long Term Monitor Instructions  Your physician has requested you wear a ZIO patch monitor for 14 days.  This is a single patch monitor. [employer] supplies one patch monitor per enrollment. Additional stickers are not available. Please do not apply patch if you will be having a Nuclear Stress Test,  Echocardiogram, Cardiac CT, MRI, or Chest Xray during the period you would be wearing the  monitor. The patch cannot be worn during these tests. You cannot remove and re-apply the  ZIO XT patch monitor.  Your ZIO patch monitor will be mailed 3 day USPS to your address on file. It may take 3-5 days  to receive your monitor after you have been enrolled.  Once you have received your monitor, please review the enclosed instructions. Your monitor  has already been registered assigning a specific monitor serial # to you.  Billing and Patient Assistance Program Information  We have supplied [employer] with any of your insurance information on file for billing purposes. [employer] offers a sliding  scale Patient Assistance Program for patients that do not have  insurance, or whose insurance does not completely cover the cost of the ZIO monitor.  You must apply for the Patient Assistance Program to qualify for this discounted rate.  To apply, please call [employer] at [phone], select option 4, select option 2, ask to apply for  Patient Assistance Program. Meredeth Ide will ask your household income, and how many people  are in your household. They will quote your out-of-pocket cost based on that information.  [employer] will also be able to set up a 45-month, interest-free payment plan if needed.  Applying the monitor   Shave hair from upper left chest.  Hold abrader disc by orange tab. Rub abrader in 40 strokes over the upper left chest as  indicated in your monitor instructions.  Clean area with 4 enclosed alcohol pads. Let dry.  Apply patch as indicated in monitor instructions. Patch will be placed under collarbone on left  side of chest with arrow pointing upward.  Rub patch adhesive wings for 2 minutes. Remove white label marked "1". Remove the white  label marked "2". Rub patch adhesive wings for 2 additional minutes.  While looking in a mirror, press and release button in center of patch. A small green light will  flash 3-4 times. This will be your only indicator that the monitor has been turned on.  Do not shower for the first 24 hours. You may shower after the first 24 hours.  Press the button if you feel a symptom. You will hear  a small click. Record Date, Time and  Symptom in the Patient Logbook.  When you are ready to remove the patch, follow instructions on the last 2 pages of Patient  Logbook. Stick patch monitor onto the last page of Patient Logbook.  Place Patient Logbook in the blue and white box. Use locking tab on box and tape box closed  securely. The blue and white box has prepaid postage on it. Please place it in the mailbox as  soon as possible. Your physician should  have your test results approximately 7 days after the  monitor has been mailed back to [facility].  Call [facility] Customer Care at [phone] if you have questions regarding  your ZIO XT patch monitor. Call them immediately if you see an orange light blinking on your  monitor.  If your monitor falls off in less than 4 days, contact our Monitor department at [phone].  If your monitor becomes loose or falls off after 4 days call [employer] at [phone] for  suggestions on securing your monitor  Follow-Up: At [facility], you and your health needs are our priority.  As part of our continuing mission to provide you with exceptional heart care, we have created designated Provider Care Teams.  These Care Teams include your primary Cardiologist (physician) and Advanced Practice Providers (APPs -  Physician Assistants and Nurse Practitioners) who all work together to provide you with the care you need, when you need it.  We recommend signing up for the patient portal called "MyChart".  Sign up information is provided on this After Visit Summary.  MyChart is used to connect with patients for Virtual Visits (Telemedicine).  Patients are able to view lab/test results, encounter notes, upcoming appointments, etc.  Non-urgent messages can be sent to your provider as well.   To learn more about what you can do with MyChart, go to [email or web address].    Your next appointment:   As needed  The format for your next appointment:   In Person  Provider:   You may see Dr. Eden Emms or one of the following Advanced Practice Providers on your designated Care Team:   [place], PA-C  Jacolyn Reedy, [place]

## 2020-07-24 ENCOUNTER — Telehealth: Payer: Self-pay

## 2020-07-24 ENCOUNTER — Other Ambulatory Visit: Payer: Self-pay

## 2020-07-24 ENCOUNTER — Ambulatory Visit (HOSPITAL_COMMUNITY)
Admission: RE | Admit: 2020-07-24 | Discharge: 2020-07-24 | Disposition: A | Discharge status: Home / Self Care | Payer: 59 | Source: Ambulatory Visit | Attending: Cardiovascular Disease | Admitting: Cardiovascular Disease

## 2020-07-24 DIAGNOSIS — R0609 Other forms of dyspnea: Secondary | ICD-10-CM

## 2020-07-24 DIAGNOSIS — R0600 Dyspnea, unspecified: Secondary | ICD-10-CM | POA: Diagnosis not present

## 2020-07-24 DIAGNOSIS — R002 Palpitations: Secondary | ICD-10-CM

## 2020-07-24 LAB — ECHOCARDIOGRAM COMPLETE
AR max vel: 2.21 cm2
AV Area VTI: 2.28 cm2
AV Area mean vel: 1.86 cm2
AV Mean grad: 5.2 mmHg
AV Peak grad: 9.9 mmHg
Ao pk vel: 1.57 m/s
Area-P 1/2: 4.02 cm2
S' Lateral: 2.5 cm

## 2020-07-24 NOTE — Telephone Encounter (Signed)
-----   Message from Wendall Stade, MD sent at 07/24/2020 11:39 AM EDT ----- Normal echo with good EF and no significant valvular heart disease

## 2020-07-24 NOTE — Telephone Encounter (Signed)
Pt notified and voiced understanding. Pt had no questions or concerns at this time. 

## 2020-07-24 NOTE — Progress Notes (Signed)
*  PRELIMINARY RESULTS* Echocardiogram 2D Echocardiogram has been performed.  Stacey Drain 07/24/2020, 9:09 AM

## 2020-08-04 ENCOUNTER — Telehealth: Payer: Self-pay | Admitting: Cardiovascular Disease

## 2020-08-04 NOTE — Telephone Encounter (Signed)
Patient said she go results from ZIO Patch calling to get her results from Dr. Eden Emms. It was worn 7/5-07/18/20. She hasn't heard nothing back yet. She can be reached at [phone]

## 2020-08-04 NOTE — Telephone Encounter (Signed)
Notified that monitor results have not been resulted yet, but as soon as they are available we will call her.

## 2020-08-07 ENCOUNTER — Telehealth: Payer: Self-pay | Admitting: Nurse Practitioner

## 2020-08-07 ENCOUNTER — Other Ambulatory Visit: Payer: Self-pay | Admitting: Nurse Practitioner

## 2020-08-07 DIAGNOSIS — E7800 Pure hypercholesterolemia, unspecified: Secondary | ICD-10-CM

## 2020-08-07 DIAGNOSIS — D75839 Thrombocytosis, unspecified: Secondary | ICD-10-CM

## 2020-08-07 NOTE — Telephone Encounter (Signed)
Patient called wanting to know if she needs to do blood work before her appt 08/13/20   Please call her back

## 2020-08-07 NOTE — Telephone Encounter (Signed)
I put the lab orders in for CBC, CMP, and lipid panel.

## 2020-08-07 NOTE — Telephone Encounter (Signed)
According to her last AVS with you, you wanted to see her for lab f/u. It doesn't look like any labs are ordered but she is going to come in tomorrow or Monday to get those drawn. What do you need me to order for her and I will?

## 2020-08-07 NOTE — Assessment & Plan Note (Signed)
Lab Results  Component Value Date   CHOL 219 (H) 02/11/2020   HDL 44 02/11/2020   LDLCALC 138 (H) 02/11/2020   TRIG 207 (H) 02/11/2020   CHOLHDL 4 06/11/2013

## 2020-08-12 LAB — CBC WITH DIFFERENTIAL/PLATELET
Basophils Absolute: 0.1 10*3/uL (ref 0.0–0.2)
Basos: 1 %
EOS (ABSOLUTE): 0.1 10*3/uL (ref 0.0–0.4)
Eos: 2 %
Hematocrit: 37.5 % (ref 34.0–46.6)
Hemoglobin: 11.9 g/dL (ref 11.1–15.9)
Immature Grans (Abs): 0 10*3/uL (ref 0.0–0.1)
Immature Granulocytes: 0 %
Lymphocytes Absolute: 1.6 10*3/uL (ref 0.7–3.1)
Lymphs: 24 %
MCH: 25.5 pg — ABNORMAL LOW (ref 26.6–33.0)
MCHC: 31.7 g/dL (ref 31.5–35.7)
MCV: 80 fL (ref 79–97)
Monocytes Absolute: 0.5 10*3/uL (ref 0.1–0.9)
Monocytes: 7 %
Neutrophils Absolute: 4.5 10*3/uL (ref 1.4–7.0)
Neutrophils: 66 %
Platelets: 515 10*3/uL — ABNORMAL HIGH (ref 150–450)
RBC: 4.67 x10E6/uL (ref 3.77–5.28)
RDW: 13.5 % (ref 11.7–15.4)
WBC: 6.9 10*3/uL (ref 3.4–10.8)

## 2020-08-12 LAB — LIPID PANEL WITH LDL/HDL RATIO
Cholesterol, Total: 187 mg/dL (ref 100–199)
HDL: 40 mg/dL (ref >39)
LDL Chol Calc (NIH): 123 mg/dL — ABNORMAL HIGH (ref 0–99)
LDL/HDL Ratio: 3.1 ratio (ref 0.0–3.2)
Triglycerides: 135 mg/dL (ref 0–149)
VLDL Cholesterol Cal: 24 mg/dL (ref 5–40)

## 2020-08-12 LAB — CMP14+EGFR
ALT: 7 IU/L (ref 0–32)
AST: 11 IU/L (ref 0–40)
Albumin/Globulin Ratio: 1.7 (ref 1.2–2.2)
Albumin: 4.5 g/dL (ref 3.8–4.8)
Alkaline Phosphatase: 81 IU/L (ref 44–121)
BUN/Creatinine Ratio: 12 (ref 9–23)
BUN: 9 mg/dL (ref 6–24)
Bilirubin Total: 0.3 mg/dL (ref 0.0–1.2)
CO2: 23 mmol/L (ref 20–29)
Calcium: 9.6 mg/dL (ref 8.7–10.2)
Chloride: 105 mmol/L (ref 96–106)
Creatinine, Ser: 0.78 mg/dL (ref 0.57–1.00)
Globulin, Total: 2.7 g/dL (ref 1.5–4.5)
Glucose: 87 mg/dL (ref 65–99)
Potassium: 4.4 mmol/L (ref 3.5–5.2)
Sodium: 142 mmol/L (ref 134–144)
Total Protein: 7.2 g/dL (ref 6.0–8.5)
eGFR: 97 mL/min/{1.73_m2} (ref >59)

## 2020-08-12 NOTE — Progress Notes (Signed)
Platelet count has improved since last OV. Cholesterol has moderate improvement as well. We will discuss this at her next appointment.

## 2020-08-13 ENCOUNTER — Other Ambulatory Visit: Payer: Self-pay

## 2020-08-13 ENCOUNTER — Ambulatory Visit (INDEPENDENT_AMBULATORY_CARE_PROVIDER_SITE_OTHER): Payer: 59 | Admitting: Nurse Practitioner

## 2020-08-13 ENCOUNTER — Encounter: Payer: Self-pay | Admitting: Nurse Practitioner

## 2020-08-13 VITALS — BP 118/79 | HR 105 | Temp 98.8°F | Ht 67.0 in | Wt 173.0 lb

## 2020-08-13 DIAGNOSIS — E7800 Pure hypercholesterolemia, unspecified: Secondary | ICD-10-CM | POA: Diagnosis not present

## 2020-08-13 DIAGNOSIS — D75839 Thrombocytosis, unspecified: Secondary | ICD-10-CM

## 2020-08-13 MED ORDER — ATORVASTATIN CALCIUM 20 MG PO TABS: 20.0000 mg | ORAL_TABLET | Freq: Every day | ORAL | 3 refills | Status: DC

## 2020-08-13 NOTE — Progress Notes (Signed)
Acute Office Visit  Subjective:    Patient ID: Renee Bean, female    DOB: December 01, 1980, 40 y.o.   MRN: [id number]  Chief Complaint  Patient presents with   Follow-up    Lab review    HPI Patient is in today for lab f/u. At her last OV, her platelet count was elevated and LDL was elevated.  History reviewed. No pertinent past medical history.  Past Surgical History:  Procedure Laterality Date   NO PAST SURGERIES      Family History  Problem Relation Age of Onset   Cancer Mother 39       colon   Hypertension Mother    Hypertension Father    Diabetes Father    High Cholesterol Father    Hypertension Maternal Grandmother    Diabetes Maternal Grandmother    Hypertension Maternal Grandfather    Diabetes Maternal Grandfather    Hypertension Paternal Grandmother    Diabetes Paternal Grandmother    Hypertension Paternal Grandfather    Diabetes Paternal Grandfather    High Cholesterol Sister     Social History   Socioeconomic History   Marital status: Single    Spouse name: Not on file   Number of children: Not on file   Years of education: Not on file   Highest education level: Not on file  Occupational History   Occupation: [employer]    Comment: deliver  Tobacco Use   Smoking status: Never   Smokeless tobacco: Never  Vaping Use   Vaping Use: Never used  Substance and Sexual Activity   Alcohol use: No   Drug use: No   Sexual activity: Not Currently    Birth control/protection: None  Other Topics Concern   Not on file  Social History Narrative   Not on file   Social Determinants of Health   Financial Resource Strain: Low Risk    Difficulty of Paying Living Expenses: Not hard at all  Food Insecurity: No Food Insecurity   Worried About Charity fundraiser in the Last Year: Never true   [place] in the Last Year: Never true  Transportation Needs: No Transportation Needs   Lack of Transportation (Medical): No   Lack of Transportation  (Non-Medical): No  Physical Activity: Sufficiently Active   Days of Exercise per Week: 5 days   Minutes of Exercise per Session: 30 min  Stress: Stress Concern Present   Feeling of Stress : To some extent  Social Connections: Moderately Integrated   Frequency of Communication with Friends and Family: More than three times a week   Frequency of Social Gatherings with Friends and Family: Three times a week   Attends Religious Services: More than 4 times per year   Active Member of Clubs or Organizations: Yes   Attends Music therapist: More than 4 times per year   Marital Status: Never married  Intimate Partner Violence: Not At Risk   Fear of Current or Ex-Partner: No   Emotionally Abused: No   Physically Abused: No   Sexually Abused: No    No outpatient medications prior to visit.   No facility-administered medications prior to visit.    No Known Allergies  Review of Systems  Constitutional: Negative.   Respiratory: Negative.    Cardiovascular: Negative.   Musculoskeletal: Negative.   Psychiatric/Behavioral: Negative.        Objective:    Physical Exam Constitutional:      Appearance: Normal appearance.  Cardiovascular:  Rate and Rhythm: Normal rate and regular rhythm.     Pulses: Normal pulses.     Heart sounds: Normal heart sounds.  Pulmonary:     Effort: Pulmonary effort is normal.     Breath sounds: Normal breath sounds.  Musculoskeletal:        General: Normal range of motion.  Neurological:     Mental Status: She is alert.  Psychiatric:        Mood and Affect: Mood normal.        Behavior: Behavior normal.        Thought Content: Thought content normal.        Judgment: Judgment normal.    BP 118/79 (BP Location: Left Arm, Patient Position: Sitting, Cuff Size: Large)   Pulse (!) 105   Temp 98.8 F (37.1 C) (Temporal)   Ht 5' 7"  (1.702 m)   Wt 173 lb (78.5 kg)   LMP 07/30/2020 (Exact Date)   SpO2 98%   BMI 27.10 kg/m  Wt Readings  from Last 3 Encounters:  08/13/20 173 lb (78.5 kg)  07/15/20 174 lb 12.8 oz (79.3 kg)  05/14/20 180 lb 3.2 oz (81.7 kg)    Health Maintenance Due  Topic Date Due   INFLUENZA VACCINE  08/11/2020    There are no preventive care reminders to display for this patient.   Lab Results  Component Value Date   TSH 1.291 07/15/2020   Lab Results  Component Value Date   WBC 6.9 08/11/2020   HGB 11.9 08/11/2020   HCT 37.5 08/11/2020   MCV 80 08/11/2020   PLT 515 (H) 08/11/2020   Lab Results  Component Value Date   NA 142 08/11/2020   K 4.4 08/11/2020   CO2 23 08/11/2020   GLUCOSE 87 08/11/2020   BUN 9 08/11/2020   CREATININE 0.78 08/11/2020   BILITOT 0.3 08/11/2020   ALKPHOS 81 08/11/2020   AST 11 08/11/2020   ALT 7 08/11/2020   PROT 7.2 08/11/2020   ALBUMIN 4.5 08/11/2020   CALCIUM 9.6 08/11/2020   ANIONGAP 8 09/03/2014   EGFR 97 08/11/2020   GFR 110.16 06/11/2013   Lab Results  Component Value Date   CHOL 187 08/11/2020   Lab Results  Component Value Date   HDL 40 08/11/2020   Lab Results  Component Value Date   LDLCALC 123 (H) 08/11/2020   Lab Results  Component Value Date   TRIG 135 08/11/2020   Lab Results  Component Value Date   CHOLHDL 4 06/11/2013   No results found for: HGBA1C     Assessment & Plan:   Problem List Items Addressed This Visit       Hematopoietic and Hemostatic   Thrombocytosis    Lab Results  Component Value Date   WBC 6.9 08/11/2020   HGB 11.9 08/11/2020   HCT 37.5 08/11/2020   MCV 80 08/11/2020   PLT 515 (H) 08/11/2020  -trending down -looking at PLT count from previous years, she has had thrombocytosis for several years -will monitor with routine labs       Relevant Orders   CBC with Differential/Platelet   CMP14+EGFR     Other   HLD (hyperlipidemia) - Primary    Lab Results  Component Value Date   CHOL 187 08/11/2020   HDL 40 08/11/2020   LDLCALC 123 (H) 08/11/2020   TRIG 135 08/11/2020   CHOLHDL 4  06/11/2013  -LDL is still elevated despite lifestyle changes -Rx. Atorvastatin -she is eating  less fatty foods; less eggs in AM and eating oatmeal and plant-based breakfasts      Relevant Medications   atorvastatin (LIPITOR) 20 MG tablet   Other Relevant Orders   CBC with Differential/Platelet   CMP14+EGFR   Lipid Panel With LDL/HDL Ratio     Meds ordered this encounter  Medications   atorvastatin (LIPITOR) 20 MG tablet    Sig: Take 1 tablet (20 mg total) by mouth daily.    Dispense:  90 tablet    Refill:  [place], NP

## 2020-08-13 NOTE — Assessment & Plan Note (Addendum)
Lab Results  Component Value Date   CHOL 187 08/11/2020   HDL 40 08/11/2020   LDLCALC 123 (H) 08/11/2020   TRIG 135 08/11/2020   CHOLHDL 4 06/11/2013   -LDL is still elevated despite lifestyle changes -Rx. Atorvastatin -she is eating less fatty foods; less eggs in AM and eating oatmeal and plant-based breakfasts

## 2020-08-13 NOTE — Assessment & Plan Note (Signed)
Lab Results  Component Value Date   WBC 6.9 08/11/2020   HGB 11.9 08/11/2020   HCT 37.5 08/11/2020   MCV 80 08/11/2020   PLT 515 (H) 08/11/2020  -trending down -looking at PLT count from previous years, she has had thrombocytosis for several years -will monitor with routine labs

## 2020-08-13 NOTE — Patient Instructions (Signed)
Please have fasting labs drawn 2-3 days prior to your appointment so we can discuss the results during your office visit.  

## 2020-10-23 ENCOUNTER — Telehealth: Payer: Self-pay

## 2020-10-23 NOTE — Telephone Encounter (Signed)
Patient called needs a referral to Dr Delene Loll for her stomach issues.

## 2020-10-23 NOTE — Telephone Encounter (Signed)
Pt needs an appt to discuss referral please

## 2020-10-23 NOTE — Telephone Encounter (Signed)
Appointment scheduled.

## 2020-10-29 ENCOUNTER — Telehealth: Payer: Self-pay | Admitting: Nurse Practitioner

## 2020-10-29 ENCOUNTER — Other Ambulatory Visit: Payer: Self-pay

## 2020-10-29 ENCOUNTER — Ambulatory Visit (INDEPENDENT_AMBULATORY_CARE_PROVIDER_SITE_OTHER): Payer: 59 | Admitting: Internal Medicine

## 2020-10-29 ENCOUNTER — Encounter: Payer: Self-pay | Admitting: Internal Medicine

## 2020-10-29 DIAGNOSIS — K625 Hemorrhage of anus and rectum: Secondary | ICD-10-CM | POA: Diagnosis not present

## 2020-10-29 DIAGNOSIS — Z800 Family history of malignant neoplasm of digestive organs: Secondary | ICD-10-CM

## 2020-10-29 DIAGNOSIS — R109 Unspecified abdominal pain: Secondary | ICD-10-CM | POA: Diagnosis not present

## 2020-10-29 MED ORDER — DICYCLOMINE HCL 10 MG PO CAPS: 10.0000 mg | ORAL_CAPSULE | Freq: Three times a day (TID) | ORAL | 0 refills | Status: DC | PRN

## 2020-10-29 NOTE — Telephone Encounter (Signed)
Pt cancelled appt due to covid exposure was needing referral to gastro changed appt to virtual visit so pt wouldn't be a no show

## 2020-10-29 NOTE — Assessment & Plan Note (Signed)
2 episodes, had few drops, BRBPR Referred to GI - will contact for sooner evaluation considering FH of colon cancer

## 2020-10-29 NOTE — Telephone Encounter (Signed)
Pt returning phone call ° °

## 2020-10-29 NOTE — Progress Notes (Signed)
Virtual Visit via Telephone Note   This visit type was conducted due to national recommendations for restrictions regarding the COVID-19 Pandemic (e.g. social distancing) in an effort to limit this patient's exposure and mitigate transmission in our community.  Due to her co-morbid illnesses, this patient is at least at moderate risk for complications without adequate follow up.  This format is felt to be most appropriate for this patient at this time.  The patient did not have access to video technology/had technical difficulties with video requiring transitioning to audio format only (telephone).  All issues noted in this document were discussed and addressed.  No physical exam could be performed with this format.  Please refer to the patient's chart for her  consent to telehealth for [facility].   Evaluation Performed:  Follow-up visit  Date:  10/29/2020   ID:  Renee Bean, DOB 19-Apr-1980, MRN [id number]  Patient Location: Home Provider Location: Office/Clinic  Location of Patient: Home Location of Provider: Telehealth Consent was obtain for visit to be over via telehealth. I verified that I am speaking with the correct person using two identifiers.  PCP:  Heather Roberts, NP   Chief Complaint: Rectal bleeding and abdominal cramps  History of Present Illness:    Renee Bean is a 40 y.o. female who has a televisit for c/o abdominal cramping and rectal bleeding X 2, 1 episode about 2 weeks ago and another episode yesterday. She had BRBPR, few drops and denies any rectal pain. She denies any melena or mucus in stool. She denies any fatigue or dyspnea. Denies any fever, chills, nausea or vomiting. She reports that her mother had colon cancer in her 46s and her maternal uncle also had colon cancer.  The patient does not have symptoms concerning for COVID-19 infection (fever, chills, cough, or new shortness of breath).   Past Medical, Surgical, Social History, Allergies,  and Medications have been Reviewed.  History reviewed. No pertinent past medical history. Past Surgical History:  Procedure Laterality Date   NO PAST SURGERIES       Current Meds  Medication Sig   dicyclomine (BENTYL) 10 MG capsule Take 1 capsule (10 mg total) by mouth 3 (three) times daily as needed for spasms.     Allergies:   Patient has no known allergies.   ROS:   Please see the history of present illness.     All other systems reviewed and are negative.   Labs/Other Tests and Data Reviewed:    Recent Labs: 07/15/2020: TSH 1.291 08/11/2020: ALT 7; BUN 9; Creatinine, Ser 0.78; Hemoglobin 11.9; Platelets 515; Potassium 4.4; Sodium 142   Recent Lipid Panel Lab Results  Component Value Date/Time   CHOL 187 08/11/2020 10:14 AM   TRIG 135 08/11/2020 10:14 AM   HDL 40 08/11/2020 10:14 AM   CHOLHDL 4 06/11/2013 08:34 AM   LDLCALC 123 (H) 08/11/2020 10:14 AM    Wt Readings from Last 3 Encounters:  08/13/20 173 lb (78.5 kg)  07/15/20 174 lb 12.8 oz (79.3 kg)  05/14/20 180 lb 3.2 oz (81.7 kg)    ASSESSMENT & PLAN:    Rectal bleeding 2 episodes, had few drops, BRBPR Referred to GI - will contact for sooner evaluation considering FH of colon cancer  Family history of colorectal cancer Mother and maternal uncle had colon cancer in 63s Referred to GI  Abdominal cramps Bentyl PRN  Time:   Today, I have spent 13 minutes reviewing the chart, including problem list,  medications, and with the patient with telehealth technology discussing the above problems.   Medication Adjustments/Labs and Tests Ordered: Current medicines are reviewed at length with the patient today.  Concerns regarding medicines are outlined above.   Tests Ordered: Orders Placed This Encounter  Procedures   Ambulatory referral to Gastroenterology    Medication Changes: Meds ordered this encounter  Medications   dicyclomine (BENTYL) 10 MG capsule    Sig: Take 1 capsule (10 mg total) by mouth 3  (three) times daily as needed for spasms.    Dispense:  30 capsule    Refill:  0     Note: This dictation was prepared with Dragon dictation along with smaller phrase technology. Similar sounding words can be transcribed inadequately or may not be corrected upon review. Any transcriptional errors that result from this process are unintentional.      Disposition:  Follow up  Signed, Anabel Halon, MD  10/29/2020 4:58 PM     [facility]

## 2020-10-29 NOTE — Assessment & Plan Note (Signed)
Mother and maternal uncle had colon cancer in 107s Referred to GI

## 2020-10-31 ENCOUNTER — Encounter (INDEPENDENT_AMBULATORY_CARE_PROVIDER_SITE_OTHER): Payer: Self-pay | Admitting: *Deleted

## 2020-11-25 ENCOUNTER — Ambulatory Visit: Payer: 59 | Admitting: Nurse Practitioner

## 2020-12-08 ENCOUNTER — Ambulatory Visit: Payer: 59 | Admitting: Nurse Practitioner

## 2021-01-20 ENCOUNTER — Other Ambulatory Visit (HOSPITAL_COMMUNITY): Payer: Self-pay | Admitting: Obstetrics & Gynecology

## 2021-01-20 DIAGNOSIS — Z1231 Encounter for screening mammogram for malignant neoplasm of breast: Secondary | ICD-10-CM

## 2021-01-23 ENCOUNTER — Ambulatory Visit (HOSPITAL_COMMUNITY)
Admission: RE | Admit: 2021-01-23 | Discharge: 2021-01-23 | Disposition: A | Discharge status: Home / Self Care | Payer: 59 | Source: Ambulatory Visit | Attending: Obstetrics & Gynecology | Admitting: Obstetrics & Gynecology

## 2021-01-23 ENCOUNTER — Other Ambulatory Visit: Payer: Self-pay

## 2021-01-23 DIAGNOSIS — Z1231 Encounter for screening mammogram for malignant neoplasm of breast: Secondary | ICD-10-CM | POA: Diagnosis present

## 2021-01-26 ENCOUNTER — Other Ambulatory Visit (HOSPITAL_COMMUNITY): Payer: Self-pay | Admitting: Obstetrics & Gynecology

## 2021-01-26 DIAGNOSIS — R928 Other abnormal and inconclusive findings on diagnostic imaging of breast: Secondary | ICD-10-CM

## 2021-01-29 ENCOUNTER — Other Ambulatory Visit: Payer: Self-pay

## 2021-01-29 ENCOUNTER — Ambulatory Visit (HOSPITAL_COMMUNITY)
Admission: RE | Admit: 2021-01-29 | Discharge: 2021-01-29 | Disposition: A | Discharge status: Home / Self Care | Payer: 59 | Source: Ambulatory Visit | Attending: Obstetrics & Gynecology | Admitting: Obstetrics & Gynecology

## 2021-01-29 DIAGNOSIS — R928 Other abnormal and inconclusive findings on diagnostic imaging of breast: Secondary | ICD-10-CM | POA: Diagnosis not present

## 2021-02-26 ENCOUNTER — Ambulatory Visit (INDEPENDENT_AMBULATORY_CARE_PROVIDER_SITE_OTHER): Payer: 59 | Admitting: Gastroenterology

## 2021-02-26 ENCOUNTER — Encounter (INDEPENDENT_AMBULATORY_CARE_PROVIDER_SITE_OTHER): Payer: Self-pay | Admitting: Gastroenterology

## 2021-02-26 ENCOUNTER — Other Ambulatory Visit (INDEPENDENT_AMBULATORY_CARE_PROVIDER_SITE_OTHER): Payer: Self-pay

## 2021-02-26 ENCOUNTER — Encounter (INDEPENDENT_AMBULATORY_CARE_PROVIDER_SITE_OTHER): Payer: Self-pay

## 2021-02-26 ENCOUNTER — Telehealth (INDEPENDENT_AMBULATORY_CARE_PROVIDER_SITE_OTHER): Payer: Self-pay

## 2021-02-26 ENCOUNTER — Other Ambulatory Visit: Payer: Self-pay

## 2021-02-26 VITALS — BP 126/82 | HR 105 | Temp 98.5°F | Ht 67.0 in | Wt 170.5 lb

## 2021-02-26 DIAGNOSIS — Z01812 Encounter for preprocedural laboratory examination: Secondary | ICD-10-CM

## 2021-02-26 DIAGNOSIS — Z800 Family history of malignant neoplasm of digestive organs: Secondary | ICD-10-CM

## 2021-02-26 DIAGNOSIS — Z1211 Encounter for screening for malignant neoplasm of colon: Secondary | ICD-10-CM

## 2021-02-26 DIAGNOSIS — K625 Hemorrhage of anus and rectum: Secondary | ICD-10-CM

## 2021-02-26 MED ORDER — PEG 3350-KCL-NA BICARB-NACL 420 G PO SOLR: 4000.0000 mL | ORAL | 0 refills | Status: DC

## 2021-02-26 NOTE — Progress Notes (Signed)
Maylon Peppers, M.D. Gastroenterology & Hepatology [facility] [address] [place], [place] [address] Primary Care Physician: Pcp, No No address on file  Referring MD: Rutwik K. Posey Pronto, MD  Chief Complaint: Rectal bleeding and family history of colon cancer  History of Present Illness: Renee Bean is a 41 y.o. female with PMH HLD, who presents for evaluation of rectal bleeding and family history of colon cancer.  Patient reports that since October 2022 she has presented intermittent rectal bleeding on top of her stool, with last episode a week ago.  States that this happens intermittently and never on a daily basis.  Denies any melena, rectal pain or straining. Usually has 1 BM every day of normal consistency. Denies any abdominal pain, although in October she had an episode of pain in her lower abdomen described as a dull pain that lasted for 3 days.   The patient denies having any nausea, vomiting, fever, chills, hematochezia, melena, hematemesis, abdominal distention, diarrhea, jaundice, pruritus. Has lost 19 lb after changing her diet, has been trying to lose weight.  Last [id number] Last Colonoscopy:never  FHx: neg for any gastrointestinal/liver disease, colon cancer at age 42 Social: neg smoking, alcohol or illicit drug use Surgical: no abdominal surgeries  Past Medical History:No past medical history on file.  Past Surgical History: Past Surgical History:  Procedure Laterality Date   NO PAST SURGERIES      Family History: Family History  Problem Relation Age of Onset   Cancer Mother 44       colon   Hypertension Mother    Hypertension Father    Diabetes Father    High Cholesterol Father    Hypertension Maternal Grandmother    Diabetes Maternal Grandmother    Hypertension Maternal Grandfather    Diabetes Maternal Grandfather    Hypertension Paternal Grandmother    Diabetes Paternal Grandmother     Hypertension Paternal Grandfather    Diabetes Paternal Grandfather    High Cholesterol Sister     Social History: Social History   Tobacco Use  Smoking Status Never  Smokeless Tobacco Never   Social History   Substance and Sexual Activity  Alcohol Use No   Social History   Substance and Sexual Activity  Drug Use No    Allergies: No Known Allergies  Medications: No current outpatient medications on file.   No current facility-administered medications for this visit.    Review of Systems: GENERAL: negative for malaise, night sweats HEENT: No changes in hearing or vision, no nose bleeds or other nasal problems. NECK: Negative for lumps, goiter, pain and significant neck swelling RESPIRATORY: Negative for cough, wheezing CARDIOVASCULAR: Negative for chest pain, leg swelling, palpitations, orthopnea GI: SEE HPI MUSCULOSKELETAL: Negative for joint pain or swelling, back pain, and muscle pain. SKIN: Negative for lesions, rash PSYCH: Negative for sleep disturbance, mood disorder and recent psychosocial stressors. HEMATOLOGY Negative for prolonged bleeding, bruising easily, and swollen nodes. ENDOCRINE: Negative for cold or heat intolerance, polyuria, polydipsia and goiter. NEURO: negative for tremor, gait imbalance, syncope and seizures. The remainder of the review of systems is noncontributory.   Physical Exam: BP 126/82 (BP Location: Left Arm, Patient Position: Sitting, Cuff Size: Normal)    Pulse (!) 105    Temp 98.5 F (36.9 C) (Oral)    Ht 5\' 7"  (1.702 m)    Wt 170 lb 8 oz (77.3 kg)    BMI 26.70 kg/m  GENERAL: The patient is AO x3, in no  acute distress. HEENT: Head is normocephalic and atraumatic. EOMI are intact. Mouth is well hydrated and without lesions. NECK: Supple. No masses LUNGS: Clear to auscultation. No presence of rhonchi/wheezing/rales. Adequate chest expansion HEART: RRR, normal s1 and s2. ABDOMEN: Soft, nontender, no guarding, no peritoneal signs,  and nondistended. BS +. No masses. EXTREMITIES: Without any cyanosis, clubbing, rash, lesions or edema. NEUROLOGIC: AOx3, no focal motor deficit. SKIN: no jaundice, no rashes   Imaging/Labs: as above  I personally reviewed and interpreted the available labs, imaging and endoscopic files.  Impression and Plan: Renee Bean is a 41 y.o. female with PMH HLD, who presents for evaluation of rectal bleeding and family history of colon cancer.  Patient reports that she has presented intermittent rectal bleeding without presence of any other red flag signs.  Differential diagnosis for her current presentation includes benign anorectal disease such as hemorrhoids or anal fissure, could also be related to diverticular bleeding, malignancy remains in the differential given her family history of colon cancer.  Disease we will proceed with a colonoscopy.  - Schedule colonoscopy  All questions were answered.      Maylon Peppers, MD Gastroenterology and Hepatology [facility]

## 2021-02-26 NOTE — Telephone Encounter (Signed)
Marieliz Strang Ann Yajahira Tison, CMA  ?

## 2021-02-26 NOTE — Patient Instructions (Signed)
Schedule colonoscopy

## 2021-02-27 ENCOUNTER — Encounter (INDEPENDENT_AMBULATORY_CARE_PROVIDER_SITE_OTHER): Payer: Self-pay

## 2021-03-12 ENCOUNTER — Encounter (HOSPITAL_COMMUNITY): Payer: 59

## 2021-03-18 ENCOUNTER — Ambulatory Visit (HOSPITAL_COMMUNITY): Admit: 2021-03-18 | Payer: 59 | Admitting: Gastroenterology

## 2021-03-18 ENCOUNTER — Encounter (HOSPITAL_COMMUNITY): Payer: Self-pay

## 2021-03-18 SURGERY — COLONOSCOPY WITH PROPOFOL
Anesthesia: Monitor Anesthesia Care

## 2021-03-23 ENCOUNTER — Encounter: Payer: Self-pay | Admitting: Nurse Practitioner

## 2021-03-23 NOTE — Telephone Encounter (Signed)
Please advise 

## 2021-03-26 ENCOUNTER — Encounter: Payer: 59 | Admitting: Nurse Practitioner

## 2021-05-27 LAB — CBC WITH DIFFERENTIAL/PLATELET
Basophils Absolute: 0 10*3/uL (ref 0.0–0.2)
Basos: 1 %
EOS (ABSOLUTE): 0.2 10*3/uL (ref 0.0–0.4)
Eos: 3 %
Hematocrit: 37.9 % (ref 34.0–46.6)
Hemoglobin: 12.2 g/dL (ref 11.1–15.9)
Immature Grans (Abs): 0 10*3/uL (ref 0.0–0.1)
Immature Granulocytes: 0 %
Lymphocytes Absolute: 1.5 10*3/uL (ref 0.7–3.1)
Lymphs: 22 %
MCH: 25.5 pg — ABNORMAL LOW (ref 26.6–33.0)
MCHC: 32.2 g/dL (ref 31.5–35.7)
MCV: 79 fL (ref 79–97)
Monocytes Absolute: 0.5 10*3/uL (ref 0.1–0.9)
Monocytes: 8 %
Neutrophils Absolute: 4.5 10*3/uL (ref 1.4–7.0)
Neutrophils: 66 %
Platelets: 474 10*3/uL — ABNORMAL HIGH (ref 150–450)
RBC: 4.79 x10E6/uL (ref 3.77–5.28)
RDW: 13.8 % (ref 11.7–15.4)
WBC: 6.8 10*3/uL (ref 3.4–10.8)

## 2021-05-27 LAB — CMP14+EGFR
ALT: 13 IU/L (ref 0–32)
AST: 15 IU/L (ref 0–40)
Albumin/Globulin Ratio: 1.4 (ref 1.2–2.2)
Albumin: 4.3 g/dL (ref 3.8–4.8)
Alkaline Phosphatase: 75 IU/L (ref 44–121)
BUN/Creatinine Ratio: 18 (ref 9–23)
BUN: 12 mg/dL (ref 6–24)
Bilirubin Total: 0.3 mg/dL (ref 0.0–1.2)
CO2: 20 mmol/L (ref 20–29)
Calcium: 9.6 mg/dL (ref 8.7–10.2)
Chloride: 103 mmol/L (ref 96–106)
Creatinine, Ser: 0.66 mg/dL (ref 0.57–1.00)
Globulin, Total: 3 g/dL (ref 1.5–4.5)
Glucose: 90 mg/dL (ref 70–99)
Potassium: 4.3 mmol/L (ref 3.5–5.2)
Sodium: 139 mmol/L (ref 134–144)
Total Protein: 7.3 g/dL (ref 6.0–8.5)
eGFR: 112 mL/min/{1.73_m2} (ref >59)

## 2021-05-27 LAB — LIPID PANEL WITH LDL/HDL RATIO
Cholesterol, Total: 179 mg/dL (ref 100–199)
HDL: 46 mg/dL (ref >39)
LDL Chol Calc (NIH): 119 mg/dL — ABNORMAL HIGH (ref 0–99)
LDL/HDL Ratio: 2.6 ratio (ref 0.0–3.2)
Triglycerides: 72 mg/dL (ref 0–149)
VLDL Cholesterol Cal: 14 mg/dL (ref 5–40)

## 2021-05-28 ENCOUNTER — Ambulatory Visit (INDEPENDENT_AMBULATORY_CARE_PROVIDER_SITE_OTHER): Payer: 59 | Admitting: Nurse Practitioner

## 2021-05-28 ENCOUNTER — Encounter: Payer: Self-pay | Admitting: Nurse Practitioner

## 2021-05-28 VITALS — BP 113/77 | HR 88 | Ht 67.0 in | Wt 180.0 lb

## 2021-05-28 DIAGNOSIS — Z0001 Encounter for general adult medical examination with abnormal findings: Secondary | ICD-10-CM

## 2021-05-28 DIAGNOSIS — Z0000 Encounter for general adult medical examination without abnormal findings: Secondary | ICD-10-CM | POA: Insufficient documentation

## 2021-05-28 DIAGNOSIS — D75839 Thrombocytosis, unspecified: Secondary | ICD-10-CM | POA: Diagnosis not present

## 2021-05-28 DIAGNOSIS — E663 Overweight: Secondary | ICD-10-CM

## 2021-05-28 DIAGNOSIS — B351 Tinea unguium: Secondary | ICD-10-CM

## 2021-05-28 DIAGNOSIS — H9313 Tinnitus, bilateral: Secondary | ICD-10-CM

## 2021-05-28 NOTE — Assessment & Plan Note (Signed)
Lab Results  Component Value Date   PLT 474 (H) 05/26/2021  Chronic condition Denies fever, chills, night sweats, weight loss Will refer to hematology to find possible causes of her condition.

## 2021-05-28 NOTE — Patient Instructions (Signed)

## 2021-05-28 NOTE — Progress Notes (Signed)
Complete physical exam  Patient: Renee Bean   DOB: May 06, 1980   41 y.o. Female  MRN: [id number]  Subjective:    Chief Complaint  Patient presents with   Annual Exam    cpe   Tinnitus    For about 2 months     Renee Bean is a 41 y.o. female with past medical history of hyperlipidemia who presents today for a complete physical exam. She reports consuming a general and low fat diet. Walk every daily for about 10 minutes daily She generally feels fairly well. She reports sleeping fairly well. She does have additional problems to discuss today.    Pt c/o ringing in both ears since the past 2 months , states that ringing is mostly in her right ear, denies loss of hearing , pain , discharge,loss of vision. Dizziness. Ringing sensation does not interfere with her sleep or daily activities    She had to reschedule her colonoscopy due to being sick , states that she will reschedule the test for September.   Has upcoming PAP with OBGYN in June due to having abnormal PAP in 2022.    Patient complained of abnormal growth and discoloration of right great toe nail for the past couple of months, stated that she stomped her right great some  years ago, she grew back a new nail but it stopped growing in the past few months,  she used OTC fungal cream but her nail remains discolored, would like a referral to the podiatrist, patient denied pain and numbness of the right great toe.   Most recent fall risk assessment:    05/28/2021    9:12 AM  Fall Risk   Falls in the past year? 0  Number falls in past yr: 0  Injury with Fall? 0  Risk for fall due to : No Fall Risks  Follow up Falls evaluation completed     Most recent depression screenings:    05/28/2021    9:12 AM 10/29/2020   11:47 AM  PHQ 2/9 Scores  PHQ - 2 Score 0 0        Patient Care Team: Donell Beers, FNP as PCP - General (Nurse Practitioner)   Outpatient Medications Prior to Visit  Medication Sig    polyethylene glycol-electrolytes (TRILYTE) 420 g solution Take 4,000 mLs by mouth as directed. (Patient not taking: Reported on 05/28/2021)   No facility-administered medications prior to visit.    Review of Systems  Constitutional: Negative.  Negative for chills, diaphoresis, fever, malaise/fatigue and weight loss.  HENT:  Positive for tinnitus. Negative for ear discharge, ear pain and hearing loss.   Eyes: Negative.  Negative for blurred vision, double vision, photophobia, pain, discharge and redness.  Respiratory: Negative.  Negative for cough, hemoptysis and sputum production.   Cardiovascular: Negative.  Negative for chest pain, palpitations and orthopnea.  Gastrointestinal: Negative.  Negative for abdominal pain, heartburn, nausea and vomiting.  Genitourinary: Negative.  Negative for dysuria, frequency and urgency.  Musculoskeletal: Negative.  Negative for back pain, myalgias and neck pain.  Skin: Negative.  Negative for itching and rash.  Neurological: Negative.  Negative for dizziness, tingling, tremors, sensory change, weakness and headaches.  Endo/Heme/Allergies: Negative.   Psychiatric/Behavioral: Negative.  Negative for depression, hallucinations, substance abuse and suicidal ideas.          Objective:  Patient deferred breast exam today   BP 113/77 (BP Location: Right Arm, Patient Position: Sitting, Cuff Size: Large)   Pulse 88  Ht 5\' 7"  (1.702 m)   Wt 180 lb (81.6 kg)   LMP 04/29/2021 (Approximate)   SpO2 99%   BMI 28.19 kg/m    Physical Exam Constitutional:      General: She is not in acute distress.    Appearance: Normal appearance. She is not ill-appearing, toxic-appearing or diaphoretic.  HENT:     Head: Normocephalic and atraumatic.     Right Ear: Tympanic membrane, ear canal and external ear normal. There is no impacted cerumen.     Left Ear: Tympanic membrane, ear canal and external ear normal. There is no impacted cerumen.     Nose: Nose normal. No  congestion or rhinorrhea.     Mouth/Throat:     Mouth: Mucous membranes are moist.     Pharynx: Oropharynx is clear. No oropharyngeal exudate or posterior oropharyngeal erythema.  Eyes:     General: No scleral icterus.       Right eye: No discharge.        Left eye: No discharge.     Extraocular Movements: Extraocular movements intact.     Conjunctiva/sclera: Conjunctivae normal.     Pupils: Pupils are equal, round, and reactive to light.  Neck:     Vascular: No carotid bruit.  Cardiovascular:     Rate and Rhythm: Normal rate and regular rhythm.     Pulses: Normal pulses.     Heart sounds: Normal heart sounds. No murmur heard.   No friction rub. No gallop.  Pulmonary:     Effort: Pulmonary effort is normal. No respiratory distress.     Breath sounds: Normal breath sounds. No stridor. No wheezing, rhonchi or rales.  Chest:     Chest wall: No tenderness.  Abdominal:     General: There is no distension.     Palpations: Abdomen is soft. There is no mass.     Tenderness: There is no abdominal tenderness. There is no right CVA tenderness, left CVA tenderness, guarding or rebound.     Hernia: No hernia is present.  Musculoskeletal:        General: No swelling, tenderness, deformity or signs of injury. Normal range of motion.     Cervical back: Normal range of motion and neck supple. No rigidity or tenderness.     Right lower leg: No edema.     Left lower leg: No edema.  Lymphadenopathy:     Cervical: No cervical adenopathy.  Skin:    General: Skin is warm and dry.     Capillary Refill: Capillary refill takes 2 to 3 seconds.     Coloration: Skin is not jaundiced or pale.     Findings: No bruising, erythema, lesion or rash.     Comments: Right great toe subungual  hyperkeratosis, toenail appear white in color,   Neurological:     Mental Status: She is alert and oriented to person, place, and time.     Cranial Nerves: No cranial nerve deficit.     Sensory: No sensory deficit.      Motor: No weakness.     Coordination: Coordination normal.     Gait: Gait normal.     Deep Tendon Reflexes: Reflexes normal.  Psychiatric:        Mood and Affect: Mood normal.        Behavior: Behavior normal.        Thought Content: Thought content normal.        Judgment: Judgment normal.     No results found  for any visits on 05/28/21.     Assessment & Plan:    Routine Health Maintenance and Physical Exam  Immunization History  Administered Date(s) Administered   PFIZER(Purple Top)SARS-COV-2 Vaccination 09/06/2019, 09/27/2019   Td 09/23/2008   Tdap 01/24/2020    Health Maintenance  Topic Date Due   INFLUENZA VACCINE  08/11/2021   PAP SMEAR-Modifier  03/22/2023   TETANUS/TDAP  01/23/2030   Hepatitis C Screening  Completed   HIV Screening  Completed   HPV VACCINES  Aged Out   COVID-19 Vaccine  Discontinued    Discussed health benefits of physical activity, and encouraged her to engage in regular exercise appropriate for her age and condition.  Problem List Items Addressed This Visit       Musculoskeletal and Integument   Toenail fungus    Referred to podiatry Denies pain to her right great toe        Relevant Orders   Ambulatory referral to Podiatry     Hematopoietic and Hemostatic   Thrombocytosis    Lab Results  Component Value Date   PLT 474 (H) 05/26/2021  Chronic condition Denies fever, chills, night sweats, weight loss Will refer to hematology to find possible causes of her condition.       Relevant Orders   Ambulatory referral to Hematology / Oncology     Other   Annual physical exam - Primary    Annual exam as documented.  Counseling done include healthy lifestyle involving committing to 150 minutes of exercise per week, heart healthy diet, and attaining healthy weight. The importance of adequate sleep also discussed.  Regular use of seat belt and home safety were also discussed . Changes in health habits are decided on by patient with  goals and time frames set for achieving them. Immunization and cancer screening  needs are specifically addressed at this visit.         Relevant Orders   Vitamin D (25 hydroxy)   TSH   HgB A1c   Overweight    Wt Readings from Last 3 Encounters:  05/28/21 180 lb (81.6 kg)  02/26/21 170 lb 8 oz (77.3 kg)  08/13/20 173 lb (78.5 kg)  Patient has added about 10 pounds since her last weight check Need to increase intake of whole food consisting mainly vegetables and protein less carbohydrate drinking at least 64 ounces of water daily engaging in regular vigorous exercise at least 150 minutes weekly importance of portion control also discussed with patient she verbalized understanding      Tinnitus of both ears    Symptoms started about 2 months ago Denies loss of hearing, pain in the ears ear discharge Will monitor for now and referred to ENT if symptoms persist       Return in about 1 year (around 05/29/2022) for CPE.     Donell Beers, FNP

## 2021-05-28 NOTE — Assessment & Plan Note (Signed)
Wt Readings from Last 3 Encounters:  05/28/21 180 lb (81.6 kg)  02/26/21 170 lb 8 oz (77.3 kg)  08/13/20 173 lb (78.5 kg)  Patient has added about 10 pounds since her last weight check Need to increase intake of whole food consisting mainly vegetables and protein less carbohydrate drinking at least 64 ounces of water daily engaging in regular vigorous exercise at least 150 minutes weekly importance of portion control also discussed with patient she verbalized understanding

## 2021-05-28 NOTE — Assessment & Plan Note (Signed)
Referred to podiatry Denies pain to her right great toe

## 2021-05-28 NOTE — Assessment & Plan Note (Signed)
Symptoms started about 2 months ago Denies loss of hearing, pain in the ears ear discharge Will monitor for now and referred to ENT if symptoms persist

## 2021-05-28 NOTE — Assessment & Plan Note (Signed)
Annual exam as documented.  ?Counseling done include healthy lifestyle involving committing to 150 minutes of exercise per week, heart healthy diet, and attaining healthy weight. The importance of adequate sleep also discussed.  ?Regular use of seat belt and home safety were also discussed . ?Changes in health habits are decided on by patient with goals and time frames set for achieving them. ?Immunization and cancer screening  needs are specifically addressed at this visit.   ?

## 2021-05-29 LAB — HEMOGLOBIN A1C
Est. average glucose Bld gHb Est-mCnc: 108 mg/dL
Hgb A1c MFr Bld: 5.4 % (ref 4.8–5.6)

## 2021-05-29 LAB — VITAMIN D 25 HYDROXY (VIT D DEFICIENCY, FRACTURES): Vit D, 25-Hydroxy: 25.7 ng/mL — ABNORMAL LOW (ref 30.0–100.0)

## 2021-05-29 LAB — TSH: TSH: 1.16 u[IU]/mL (ref 0.450–4.500)

## 2021-05-29 NOTE — Progress Notes (Signed)
Vitamin D deficiency.  Patient should start taking vitamin D 1000 units daily Normal thyroid function, normal A1c no diabetes

## 2021-06-16 ENCOUNTER — Encounter: Payer: Self-pay | Admitting: Podiatry

## 2021-06-16 ENCOUNTER — Ambulatory Visit: Payer: 59 | Admitting: Podiatry

## 2021-06-16 DIAGNOSIS — B351 Tinea unguium: Secondary | ICD-10-CM | POA: Diagnosis not present

## 2021-06-16 MED ORDER — CICLOPIROX 8 % EX SOLN: Freq: Every day | CUTANEOUS | 0 refills | Status: DC

## 2021-06-16 NOTE — Progress Notes (Signed)
  Subjective:  Patient ID: Renee Bean, female    DOB: 1980-07-04,   MRN: [id number]  Chief Complaint  Patient presents with   Nail Problem    Bilateral nail tickness and discoloration of toes 1-5. Pt states she has tried topical antifungals but they did not help.     41 y.o. female presents for concern of bilateral nail fungus. Relates that the nails are thick and discolored yellow. She has tried a topical antifungal that did not help. Denies diabetes.  . Denies any other pedal complaints. Denies n/v/f/c.   No past medical history on file.  Objective:  Physical Exam: Vascular: DP/PT pulses 2/4 bilateral. CFT <3 seconds. Normal hair growth on digits. No edema.  Skin. No lacerations or abrasions bilateral feet. Bilateral hallux nails as well as second and third digits are thickened and with some discoloration.  Musculoskeletal: MMT 5/5 bilateral lower extremities in DF, PF, Inversion and Eversion. Deceased ROM in DF of ankle joint.  Neurological: Sensation intact to light touch.   Assessment:   1. Onychomycosis      Plan:  Patient was evaluated and treated and all questions answered. -Examined patient -Discussed treatment options for painful dystrophic nails  -Discussed fungal nail treatment options including oral, topical, and laser treatments.  -Prescription for penlac provided.  -Patient to return in 3 months for re-check   Louann Sjogren, DPM

## 2021-06-17 NOTE — Progress Notes (Signed)
[facility] [address], [place] [address]   CLINIC:  Medical Oncology/Hematology  CONSULT NOTE  Patient Care Team: Renee Rival, FNP as PCP - General (Nurse Practitioner)  CHIEF COMPLAINTS/PURPOSE OF CONSULTATION:  Thrombocytosis  HISTORY OF PRESENTING ILLNESS:  Renee Bean 41 y.o. female is here at the request of her primary care provider (NP Vena Rua) for evaluation of thrombocytosis.  Review of past labs shows mildly elevated platelets since 2012, ranging from 419-534.  Most recent CBC (05/26/2021) with platelets 474; normal Hgb 12.2 with mild microcytosis and MCV 79; normal WBC and differential.  Patient reports that she has rectal bleeding few times each month with "red blood mixed into her stool."  She has been evaluated by Dr. Jenetta Downer and was scheduled for colonoscopy last month but had to cancel due to illness.  She is planning to reschedule her colonoscopy for September.  She also reports having regular blood loss from menstrual periods.  She does not take any iron supplementation at home.  She denies any history of DVT, PE, MI, or CVA. She reports tinnitus for the past 2 to 3 months, but denies any dizziness, blurry vision, strokelike symptoms, or neuropathy. She denies any aquagenic pruritus, Raynaud's phenomenon, or erythromelalgia.  No B symptoms such as fever, chills, night sweats, unintentional weight loss.  She has not had any abdominal pain, nausea, or early satiety.  She is a lifelong non-smoker.  She does not have any history of inflammatory disease or connective tissue disorder.  She does not have any personal history of cancer and is up-to-date on her annual mammograms.  She does not have any family history of myeloproliferative neoplasm, but family history is significant for mother diagnosed with colon cancer at age 61.  She has no other significant past medical history.  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  She denies any tobacco, alcohol, or illicit drug use.  Family history is significant for mother with colon cancer diagnosed at age 6, paternal aunt with brain tumor, and paternal aunt with breast cancer.   MEDICAL HISTORY:  Denies any significant past medical history  SURGICAL HISTORY: Past Surgical History:  Procedure Laterality Date   NO PAST SURGERIES      SOCIAL HISTORY: Social History   Socioeconomic History   Marital status: Single    Spouse name: Not on file   Number of children: Not on file   Years of education: Not on file   Highest education level: Not on file  Occupational History   Occupation: [employer]    Comment: deliver  Tobacco Use   Smoking status: Never   Smokeless tobacco: Never  Vaping Use   Vaping Use: Never used  Substance and Sexual Activity   Alcohol use: No   Drug use: No   Sexual activity: Not Currently    Birth control/protection: None  Other Topics Concern   Not on file  Social History Narrative   Lives home alone with her 2 dogs.    Social Determinants of Health   Financial Resource Strain: Not on file  Food Insecurity: Not on file  Transportation Needs: Not on file  Physical Activity: Not on file  Stress: Not on file  Social Connections: Not on file  Intimate Partner Violence: Not on file    FAMILY HISTORY: Family History  Problem Relation Age of Onset   Cancer Mother 10       colon  Hypertension Mother    Hypertension Father    Diabetes Father    High Cholesterol Father    High Cholesterol Sister    Hypertension Maternal Grandmother    Diabetes Maternal Grandmother    Hypertension Maternal Grandfather    Diabetes Maternal Grandfather    Hypertension Paternal Grandmother    Diabetes Paternal Grandmother    Hypertension Paternal Grandfather    Diabetes Paternal Grandfather    Breast cancer Neg Hx    Cervical cancer Neg Hx     ALLERGIES:  has No Known Allergies.  MEDICATIONS:   Current Outpatient Medications  Medication Sig Dispense Refill   ciclopirox (PENLAC) 8 % solution Apply topically at bedtime. Apply over nail and surrounding skin. Apply daily over previous coat. After seven (7) days, may remove with alcohol and continue cycle. 6.6 mL 0   polyethylene glycol-electrolytes (TRILYTE) 420 g solution Take 4,000 mLs by mouth as directed. (Patient not taking: Reported on 05/28/2021) 4000 mL 0   No current facility-administered medications for this visit.    REVIEW OF SYSTEMS:   Review of Systems  Constitutional:  Negative for appetite change, chills, diaphoresis, fatigue, fever and unexpected weight change.  HENT:   Positive for tinnitus. Negative for lump/mass and nosebleeds.   Eyes:  Negative for eye problems.  Respiratory:  Positive for cough (Occasional). Negative for hemoptysis and shortness of breath.   Cardiovascular:  Negative for chest pain, leg swelling and palpitations.  Gastrointestinal:  Positive for blood in stool. Negative for abdominal pain, constipation, diarrhea, nausea and vomiting.  Genitourinary:  Negative for hematuria.   Skin: Negative.   Neurological:  Positive for headaches (Occasional). Negative for dizziness and light-headedness.  Hematological:  Does not bruise/bleed easily.  Psychiatric/Behavioral:  Positive for sleep disturbance.       PHYSICAL EXAMINATION: ECOG PERFORMANCE STATUS: 0 - Asymptomatic  There were no vitals filed for this visit. There were no vitals filed for this visit.  Physical Exam Constitutional:      Appearance: Normal appearance.  HENT:     Head: Normocephalic and atraumatic.     Mouth/Throat:     Mouth: Mucous membranes are moist.  Eyes:     Extraocular Movements: Extraocular movements intact.     Pupils: Pupils are equal, round, and reactive to light.  Cardiovascular:     Rate and Rhythm: Normal rate and regular rhythm.     Pulses: Normal pulses.     Heart sounds: Normal heart sounds.   Pulmonary:     Effort: Pulmonary effort is normal.     Breath sounds: Normal breath sounds.  Abdominal:     General: Bowel sounds are normal.     Palpations: Abdomen is soft.     Tenderness: There is no abdominal tenderness.  Musculoskeletal:        General: No swelling.     Right lower leg: No edema.     Left lower leg: No edema.  Lymphadenopathy:     Cervical: No cervical adenopathy.  Skin:    General: Skin is warm and dry.  Neurological:     General: No focal deficit present.     Mental Status: She is alert and oriented to person, place, and time.  Psychiatric:        Mood and Affect: Mood normal.        Behavior: Behavior normal.       LABORATORY DATA:  I have reviewed the data as listed Recent Results (from the past 2160 hour(s))  CBC with Differential/Platelet     Status: Abnormal   Collection Time: 05/26/21  9:35 AM  Result Value Ref Range   WBC 6.8 3.4 - 10.8 x10E3/uL   RBC 4.79 3.77 - 5.28 x10E6/uL   Hemoglobin 12.2 11.1 - 15.9 g/dL   Hematocrit 37.9 34.0 - 46.6 %   MCV 79 79 - 97 fL   MCH 25.5 (L) 26.6 - 33.0 pg   MCHC 32.2 31.5 - 35.7 g/dL   RDW 13.8 11.7 - 15.4 %   Platelets 474 (H) 150 - 450 x10E3/uL   Neutrophils 66 Not Estab. %   Lymphs 22 Not Estab. %   Monocytes 8 Not Estab. %   Eos 3 Not Estab. %   Basos 1 Not Estab. %   Neutrophils Absolute 4.5 1.4 - 7.0 x10E3/uL   Lymphocytes Absolute 1.5 0.7 - 3.1 x10E3/uL   Monocytes Absolute 0.5 0.1 - 0.9 x10E3/uL   EOS (ABSOLUTE) 0.2 0.0 - 0.4 x10E3/uL   Basophils Absolute 0.0 0.0 - 0.2 x10E3/uL   Immature Granulocytes 0 Not Estab. %   Immature Grans (Abs) 0.0 0.0 - 0.1 x10E3/uL  CMP14+EGFR     Status: None   Collection Time: 05/26/21  9:35 AM  Result Value Ref Range   Glucose 90 70 - 99 mg/dL   BUN 12 6 - 24 mg/dL   Creatinine, Ser 0.66 0.57 - 1.00 mg/dL   eGFR 112 >59 mL/min/1.73   BUN/Creatinine Ratio 18 9 - 23   Sodium 139 134 - 144 mmol/L   Potassium 4.3 3.5 - 5.2 mmol/L   Chloride 103 96 -  106 mmol/L   CO2 20 20 - 29 mmol/L   Calcium 9.6 8.7 - 10.2 mg/dL   Total Protein 7.3 6.0 - 8.5 g/dL   Albumin 4.3 3.8 - 4.8 g/dL   Globulin, Total 3.0 1.5 - 4.5 g/dL   Albumin/Globulin Ratio 1.4 1.2 - 2.2   Bilirubin Total 0.3 0.0 - 1.2 mg/dL   Alkaline Phosphatase 75 44 - 121 IU/L   AST 15 0 - 40 IU/L   ALT 13 0 - 32 IU/L  Lipid Panel With LDL/HDL Ratio     Status: Abnormal   Collection Time: 05/26/21  9:35 AM  Result Value Ref Range   Cholesterol, Total 179 100 - 199 mg/dL   Triglycerides 72 0 - 149 mg/dL   HDL 46 >39 mg/dL   VLDL Cholesterol Cal 14 5 - 40 mg/dL   LDL Chol Calc (NIH) 119 (H) 0 - 99 mg/dL   LDL/HDL Ratio 2.6 0.0 - 3.2 ratio    Comment:                                     LDL/HDL Ratio                                             Men  Women                               1/2 Avg.Risk  1.0    1.5  Avg.Risk  3.6    3.2                                2X Avg.Risk  6.2    5.0                                3X Avg.Risk  8.0    6.1   Vitamin D (25 hydroxy)     Status: Abnormal   Collection Time: 05/28/21  9:54 AM  Result Value Ref Range   Vit D, 25-Hydroxy 25.7 (L) 30.0 - 100.0 ng/mL    Comment: Vitamin D deficiency has been defined by the [place] practice guideline as a level of serum 25-OH vitamin D less than 20 ng/mL (1,2). The Endocrine Society went on to further define vitamin D insufficiency as a level between 21 and 29 ng/mL (2). 1. IOM (Institute of Medicine). 2010. Dietary reference    intakes for calcium and D. [place]: The    [employer]. 2. Holick MF, Binkley Milan, Bischoff-Ferrari HA, et al.    Evaluation, treatment, and prevention of vitamin D    deficiency: an Endocrine Society clinical practice    guideline. JCEM. 2011 Jul; 96(7):1911-30.   TSH     Status: None   Collection Time: 05/28/21  9:54 AM  Result Value Ref Range   TSH 1.160 0.450 - 4.500 uIU/mL   HgB A1c     Status: None   Collection Time: 05/28/21  9:54 AM  Result Value Ref Range   Hgb A1c MFr Bld 5.4 4.8 - 5.6 %    Comment:          Prediabetes: 5.7 - 6.4          Diabetes: >6.4          Glycemic control for adults with diabetes: <7.0    Est. average glucose Bld gHb Est-mCnc 108 mg/dL    RADIOGRAPHIC STUDIES: I have personally reviewed the radiological images as listed and agreed with the findings in the report. No results found.  ASSESSMENT & PLAN: 1.  Thrombocytosis - Seen at the request of her primary care provider (NP Vena Rua) for evaluation of thrombocytosis. - Review of past labs shows mildly elevated platelets since 2012, ranging from 419-534.  Most recent CBC (05/26/2021) with platelets 474; normal Hgb 12.2 with mild microcytosis and MCV 79; normal WBC and differential. - Rectal bleeding few times each month, planning for colonoscopy with Dr. Jenetta Downer.  Regular blood loss from menstrual periods. - Does not take any iron supplementation at home. - No history of DVT, PE, MI, or CVA. - Symptomatic with tinnitus for 2-3 months.  No erythromelalgia, aquagenic pruritus, or other vasomotor symptoms. - Lifelong non-smoker.  No autoimmune, inflammatory, or connective tissue disorder. - Up-to-date on annual mammograms. - Family history significant for mother diagnosed with colon cancer at age 31. - PLAN: Given her history of rectal bleeding and menstrual blood loss without iron supplementation, high suspicion that this may be reactive thrombocytosis in the setting of iron deficiency. - We will check repeat CBC, ferritin, iron/TIBC.  We will also check ESR, CRP, ANA, and rheumatoid factor.  We will check MPN studies (JAK2/reflex and BCR/ABL). - Agree with colonoscopy per Dr. Jenetta Downer, due to history of rectal bleeding and mother's history of colon cancer diagnosed at  age 46.  Patient is planning to reschedule her colonoscopy for September this year. - RTC in 2 weeks to  discuss results  2.  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  She denies any tobacco, alcohol, or illicit drug use. - FAMILY:  Family history is significant for mother with colon cancer diagnosed at age 90, paternal aunt with brain tumor, and paternal aunt with breast cancer.   All questions were answered. The patient knows to call the clinic with any problems, questions or concerns.   Medical decision making: Moderate  Time spent on visit: I spent 25 minutes counseling the patient face to face. The total time spent in the appointment was 40 minutes and more than 50% was on counseling.  I, Tarri Abernethy PA-C, have seen this patient in conjunction with Dr. Derek Jack.  Greater than 50% of visit was performed by Dr. Delton Coombes.   Harriett Rush, PA-C 06/18/2021 9:24 AM  DR. Delmont Prosch: I have independently evaluated this patient and formulated my own assessment and plan.  I agree with HPI, assessment and plan written by Casey Burkitt, PA-C.  Patient evaluated for thrombocytosis of 10 years duration.  She does not have any vasomotor symptoms or aquagenic pruritus.  No prior history of thrombosis.  She also has slight microcytosis on most recent labs.  Hence differential diagnosis includes reactive thrombocytosis from iron deficiency.  However due to the chronicity of thrombocytosis, will also rule out myeloproliferative disorders.

## 2021-06-18 ENCOUNTER — Encounter (HOSPITAL_COMMUNITY): Payer: Self-pay | Admitting: Hematology

## 2021-06-18 ENCOUNTER — Inpatient Hospital Stay (HOSPITAL_COMMUNITY): Payer: 59 | Attending: Hematology | Admitting: Hematology

## 2021-06-18 ENCOUNTER — Inpatient Hospital Stay (HOSPITAL_COMMUNITY): Payer: 59

## 2021-06-18 VITALS — BP 126/81 | HR 96 | Temp 98.3°F | Resp 18 | Ht 67.0 in | Wt 180.6 lb

## 2021-06-18 DIAGNOSIS — Z800 Family history of malignant neoplasm of digestive organs: Secondary | ICD-10-CM | POA: Insufficient documentation

## 2021-06-18 DIAGNOSIS — D75839 Thrombocytosis, unspecified: Secondary | ICD-10-CM | POA: Insufficient documentation

## 2021-06-18 DIAGNOSIS — R519 Headache, unspecified: Secondary | ICD-10-CM | POA: Insufficient documentation

## 2021-06-18 DIAGNOSIS — G2581 Restless legs syndrome: Secondary | ICD-10-CM | POA: Diagnosis not present

## 2021-06-18 DIAGNOSIS — K625 Hemorrhage of anus and rectum: Secondary | ICD-10-CM | POA: Diagnosis not present

## 2021-06-18 DIAGNOSIS — R5383 Other fatigue: Secondary | ICD-10-CM | POA: Diagnosis not present

## 2021-06-18 LAB — CBC WITH DIFFERENTIAL/PLATELET
Abs Immature Granulocytes: 0.02 10*3/uL (ref 0.00–0.07)
Basophils Absolute: 0.1 10*3/uL (ref 0.0–0.1)
Basophils Relative: 1 %
Eosinophils Absolute: 0.3 10*3/uL (ref 0.0–0.5)
Eosinophils Relative: 4 %
HCT: 38.2 % (ref 36.0–46.0)
Hemoglobin: 12.1 g/dL (ref 12.0–15.0)
Immature Granulocytes: 0 %
Lymphocytes Relative: 22 %
Lymphs Abs: 1.5 10*3/uL (ref 0.7–4.0)
MCH: 25.1 pg — ABNORMAL LOW (ref 26.0–34.0)
MCHC: 31.7 g/dL (ref 30.0–36.0)
MCV: 79.3 fL — ABNORMAL LOW (ref 80.0–100.0)
Monocytes Absolute: 0.4 10*3/uL (ref 0.1–1.0)
Monocytes Relative: 6 %
Neutro Abs: 4.4 10*3/uL (ref 1.7–7.7)
Neutrophils Relative %: 67 %
Platelets: 474 10*3/uL — ABNORMAL HIGH (ref 150–400)
RBC: 4.82 MIL/uL (ref 3.87–5.11)
RDW: 14.1 % (ref 11.5–15.5)
WBC: 6.7 10*3/uL (ref 4.0–10.5)
nRBC: 0 % (ref 0.0–0.2)

## 2021-06-18 LAB — IRON AND TIBC
Iron: 30 ug/dL (ref 28–170)
Saturation Ratios: 7 % — ABNORMAL LOW (ref 10.4–31.8)
TIBC: 434 ug/dL (ref 250–450)
UIBC: 404 ug/dL

## 2021-06-18 LAB — SEDIMENTATION RATE: Sed Rate: 18 mm/hr (ref 0–22)

## 2021-06-18 LAB — C-REACTIVE PROTEIN: CRP: 0.6 mg/dL (ref <1.0)

## 2021-06-18 LAB — FERRITIN: Ferritin: 8 ng/mL — ABNORMAL LOW (ref 11–307)

## 2021-06-18 NOTE — Patient Instructions (Signed)
[facility] at [facility] Discharge Instructions  You were seen today by Dr. Ellin Saba & Rojelio Brenner PA-C for your elevated platelets ("thrombocytosis").  As we discussed, this may be due to iron deficiency from regular blood loss (menstrual periods and rectal bleeding), but we will check additional labs today to make sure you do not have any underlying blood condition causing her platelets to be high.  LABS: Check labs today on the first floor before you leave the hospital building.  FOLLOW-UP APPOINTMENT: Office visit in 2 weeks to discuss results and next steps.   Thank you for choosing [facility] at [facility] to provide your oncology and hematology care.  To afford each patient quality time with our provider, please arrive at least 15 minutes before your scheduled appointment time.   If you have a lab appointment with the Cancer Center please come in thru the Main Entrance and check in at the main information desk.  You need to re-schedule your appointment should you arrive 10 or more minutes late.  We strive to give you quality time with our providers, and arriving late affects you and other patients whose appointments are after yours.  Also, if you no show three or more times for appointments you may be dismissed from the clinic at the providers discretion.     Again, thank you for choosing [facility]  Our hope is that these requests will decrease the amount of time that you wait before being seen by our physicians.       _____________________________________________________________  Should you have questions after your visit to [facility], please contact our office at [phone] and follow the prompts.  Our office hours are 8:00 a.m. and 4:30 p.m. Monday - Friday.  Please note that voicemails left after 4:00 p.m. may not be returned until the following business day.  We are closed weekends and major  holidays.  You do have access to a nurse 24-7, just call the main number to the clinic [phone] and do not press any options, hold on the line and a nurse will answer the phone.    For prescription refill requests, have your pharmacy contact our office and allow 72 hours.    Due to Covid, you will need to wear a mask upon entering the hospital. If you do not have a mask, a mask will be given to you at the Main Entrance upon arrival. For doctor visits, patients may have 1 support person age 64 or older with them. For treatment visits, patients can not have anyone with them due to social distancing guidelines and our immunocompromised population.

## 2021-06-19 LAB — ANA: Anti Nuclear Antibody (ANA): NEGATIVE

## 2021-06-19 LAB — RHEUMATOID FACTOR: Rheumatoid fact SerPl-aCnc: <10 IU/mL (ref <14.0)

## 2021-06-22 LAB — BCR-ABL1 FISH
Cells Analyzed: 200
Cells Counted: 200

## 2021-06-29 LAB — JAK2 V617F RFX CALR/MPL/E12-15

## 2021-06-29 LAB — CALR +MPL + E12-E15  (REFLEX)

## 2021-07-02 NOTE — Progress Notes (Signed)
[facility] [address] [place], [place] [address]   CLINIC:  Medical Oncology/Hematology  PCP:  Donell Beers, FNP [address] [place] [address] [id number]   REASON FOR VISIT:  Follow-up for thrombocytosis  PRIOR THERAPY: None  CURRENT THERAPY: Under work-up  INTERVAL HISTORY:  [name] 41 y.o. female returns for routine follow-up of her thrombocytosis.  She was seen for initial visit by Dr. Ellin Saba and Rojelio Brenner PA-C on 06/18/2021.  At today's visit, she reports feeling fairly well.  She denies any changes in her health status since her initial visit 2 weeks ago.  She continues to report intermittent rectal bleeding and regular blood loss from menstrual periods.  She continues to report occasional tinnitus but denies any dizziness, blurry vision, strokelike symptoms, or neuropathy.  No aquagenic pruritus or Raynaud's phenomenon.  No B symptoms.  She admits to mild fatigue, restless legs, and headaches.  She denies any pica. She has 75% energy and 100% appetite. She endorses that she is maintaining a stable weight.   REVIEW OF SYSTEMS:    Review of Systems  Constitutional:  Positive for fatigue. Negative for appetite change, chills, diaphoresis, fever and unexpected weight change.  HENT:   Negative for lump/mass and nosebleeds.   Eyes:  Negative for eye problems.  Respiratory:  Positive for shortness of breath (with exertion). Negative for cough and hemoptysis.   Cardiovascular:  Negative for chest pain, leg swelling and palpitations.  Gastrointestinal:  Positive for blood in stool. Negative for abdominal pain, constipation, diarrhea, nausea and vomiting.  Genitourinary:  Negative for hematuria.   Skin: Negative.   Neurological:  Positive for headaches. Negative for dizziness and light-headedness.  Hematological:  Does not bruise/bleed easily.      PAST MEDICAL/SURGICAL HISTORY:  No past medical history on  file. Past Surgical History:  Procedure Laterality Date   NO PAST SURGERIES       SOCIAL HISTORY:  Social History   Socioeconomic History   Marital status: Single    Spouse name: Not on file   Number of children: Not on file   Years of education: Not on file   Highest education level: Not on file  Occupational History   Occupation: [employer]    Comment: deliver  Tobacco Use   Smoking status: Never   Smokeless tobacco: Never  Vaping Use   Vaping Use: Never used  Substance and Sexual Activity   Alcohol use: No   Drug use: No   Sexual activity: Not Currently    Birth control/protection: None  Other Topics Concern   Not on file  Social History Narrative   Lives home alone with her 2 dogs.    Social Determinants of Health   Financial Resource Strain: Low Risk  (03/21/2020)   Overall Financial Resource Strain (CARDIA)    Difficulty of Paying Living Expenses: Not hard at all  Food Insecurity: No Food Insecurity (03/21/2020)   Hunger Vital Sign    Worried About Running Out of Food in the Last Year: Never true    Ran Out of Food in the Last Year: Never true  Transportation Needs: No Transportation Needs (03/21/2020)   PRAPARE - Administrator, Civil Service (Medical): No    Lack of Transportation (Non-Medical): No  Physical Activity: Sufficiently Active (03/21/2020)   Exercise Vital Sign    Days of Exercise per Week: 5 days    Minutes of Exercise per Session: 30 min  Stress: Stress Concern Present (03/21/2020)   [employer] of Occupational Health - Occupational Stress Questionnaire    Feeling of Stress : To some extent  Social Connections: Moderately Integrated (03/21/2020)   Social Connection and Isolation Panel [NHANES]    Frequency of Communication with Friends and Family: More than three times a week    Frequency of Social Gatherings with Friends and Family: Three times a week    Attends Religious Services: More than 4 times per year    Active Member of  Clubs or Organizations: Yes    Attends Banker Meetings: More than 4 times per year    Marital Status: Never married  Intimate Partner Violence: Not At Risk (03/21/2020)   Humiliation, Afraid, Rape, and Kick questionnaire    Fear of Current or Ex-Partner: No    Emotionally Abused: No    Physically Abused: No    Sexually Abused: No    FAMILY HISTORY:  Family History  Problem Relation Age of Onset   Cancer Mother 66       colon   Hypertension Mother    Hypertension Father    Diabetes Father    High Cholesterol Father    High Cholesterol Sister    Hypertension Maternal Grandmother    Diabetes Maternal Grandmother    Hypertension Maternal Grandfather    Diabetes Maternal Grandfather    Hypertension Paternal Grandmother    Diabetes Paternal Grandmother    Hypertension Paternal Grandfather    Diabetes Paternal Grandfather    Breast cancer Neg Hx    Cervical cancer Neg Hx     CURRENT MEDICATIONS:  Outpatient Encounter Medications as of 07/03/2021  Medication Sig   ciclopirox (PENLAC) 8 % solution Apply topically at bedtime. Apply over nail and surrounding skin. Apply daily over previous coat. After seven (7) days, may remove with alcohol and continue cycle.   polyethylene glycol-electrolytes (TRILYTE) 420 g solution Take 4,000 mLs by mouth as directed.   No facility-administered encounter medications on file as of 07/03/2021.    ALLERGIES:  No Known Allergies   PHYSICAL EXAM:    ECOG PERFORMANCE STATUS: 1 - Symptomatic but completely ambulatory  There were no vitals filed for this visit. There were no vitals filed for this visit. Physical Exam Constitutional:      Appearance: Normal appearance.  HENT:     Head: Normocephalic and atraumatic.     Mouth/Throat:     Mouth: Mucous membranes are moist.  Eyes:     Extraocular Movements: Extraocular movements intact.     Pupils: Pupils are equal, round, and reactive to light.  Cardiovascular:     Rate and  Rhythm: Regular rhythm. Tachycardia present.     Pulses: Normal pulses.     Heart sounds: Normal heart sounds.  Pulmonary:     Effort: Pulmonary effort is normal.     Breath sounds: Examination of the right-upper field reveals wheezing. Examination of the right-middle field reveals wheezing. Examination of the right-lower field reveals rhonchi. Wheezing and rhonchi present.  Abdominal:     General: Bowel sounds are normal.     Palpations: Abdomen is soft.     Tenderness: There is no abdominal tenderness.  Musculoskeletal:        General: No swelling.     Right lower leg: No edema.     Left lower leg: No edema.  Lymphadenopathy:     Cervical: No cervical adenopathy.  Skin:    General: Skin is warm and dry.  Neurological:     General: No focal deficit present.     Mental Status: She is alert and oriented to person, place, and time.  Psychiatric:        Mood and Affect: Mood normal.        Behavior: Behavior normal.     LABORATORY DATA:  I have reviewed the labs as listed.  CBC    Component Value Date/Time   WBC 6.7 06/18/2021 0935   RBC 4.82 06/18/2021 0935   HGB 12.1 06/18/2021 0935   HGB 12.2 05/26/2021 0935   HCT 38.2 06/18/2021 0935   HCT 37.9 05/26/2021 0935   PLT 474 (H) 06/18/2021 0935   PLT 474 (H) 05/26/2021 0935   MCV 79.3 (L) 06/18/2021 0935   MCV 79 05/26/2021 0935   MCH 25.1 (L) 06/18/2021 0935   MCHC 31.7 06/18/2021 0935   RDW 14.1 06/18/2021 0935   RDW 13.8 05/26/2021 0935   LYMPHSABS 1.5 06/18/2021 0935   LYMPHSABS 1.5 05/26/2021 0935   MONOABS 0.4 06/18/2021 0935   EOSABS 0.3 06/18/2021 0935   EOSABS 0.2 05/26/2021 0935   BASOSABS 0.1 06/18/2021 0935   BASOSABS 0.0 05/26/2021 0935      Latest Ref Rng & Units 05/26/2021    9:35 AM 08/11/2020   10:14 AM 02/11/2020    9:48 AM  CMP  Glucose 70 - 99 mg/dL 90  87  90   BUN 6 - 24 mg/dL 12  9  11    Creatinine 0.57 - 1.00 mg/dL 4.69  6.29  5.28   Sodium 134 - 144 mmol/L 139  142  143   Potassium 3.5  - 5.2 mmol/L 4.3  4.4  4.2   Chloride 96 - 106 mmol/L 103  105  105   CO2 20 - 29 mmol/L 20  23  21    Calcium 8.7 - 10.2 mg/dL 9.6  9.6  41.3   Total Protein 6.0 - 8.5 g/dL 7.3  7.2  7.6   Total Bilirubin 0.0 - 1.2 mg/dL 0.3  0.3  0.3   Alkaline Phos 44 - 121 IU/L 75  81  80   AST 0 - 40 IU/L 15  11  13    ALT 0 - 32 IU/L 13  7  13      DIAGNOSTIC IMAGING:  I have independently reviewed the relevant imaging and discussed with the patient.  ASSESSMENT & PLAN: 1.  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 - Review of past labs shows mildly elevated platelets since 2012, ranging from 419-534.  Most recent CBC (05/26/2021) with platelets 474; normal Hgb 12.2 with mild microcytosis and MCV 79; normal WBC and differential. - Rectal bleeding few times each month, planning for colonoscopy with Dr. Levon Hedger.  Regular blood loss from menstrual periods. - Does not take any iron supplementation at home. - No history of DVT, PE, MI, or CVA. - Symptomatic with tinnitus for 2-3 months.  No erythromelalgia, aquagenic pruritus, or other vasomotor symptoms. - Lifelong non-smoker.  No autoimmune, inflammatory, or connective tissue disorder. - Up-to-date on annual mammograms. - Family history significant for mother diagnosed with colon cancer at age 37. - Hematology work-up (06/18/2021): JAK2 with reflex to CALR/MPL was negative.  Negative BCR/ABL FISH Normal CRP, ESR, ANA, RF Ferritin 8, iron saturation 7%, TIBC 434 - CBC (06/18/2021) with platelets 474, Hgb 12.1/MCV 79.3 - PLAN: Recommend trial of oral iron supplementation with  ferrous sulfate once daily in the morning with orange juice.  Can take OTC stool softener if experiencing significant constipation.  If having issues with tolerability, can decrease to every other day dosing. - Agree with colonoscopy per Dr. Levon Hedger, due to history of rectal  bleeding and mother's history of colon cancer diagnosed at age 51.  Patient is planning to reschedule her colonoscopy for September this year. - Repeat labs and RTC in 3 months with PHONE visit   2.  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  She denies any tobacco, alcohol, or illicit drug use. - FAMILY:  Family history is significant for mother with colon cancer diagnosed at age 18, paternal aunt with brain tumor, and paternal aunt with breast cancer   All questions were answered. The patient knows to call the clinic with any problems, questions or concerns.  Medical decision making: Low  Time spent on visit: I spent 15 minutes counseling the patient face to face. The total time spent in the appointment was 23 minutes and more than 50% was on counseling.   Carnella Guadalajara, PA-C  07/03/2021 10:05 AM

## 2021-07-03 ENCOUNTER — Inpatient Hospital Stay (HOSPITAL_COMMUNITY): Payer: 59 | Admitting: Physician Assistant

## 2021-07-03 ENCOUNTER — Encounter (HOSPITAL_COMMUNITY): Payer: Self-pay | Admitting: Physician Assistant

## 2021-07-03 VITALS — BP 137/85 | HR 109 | Temp 97.7°F | Resp 18 | Ht 67.0 in | Wt 179.5 lb

## 2021-07-03 DIAGNOSIS — D75839 Thrombocytosis, unspecified: Secondary | ICD-10-CM

## 2021-07-03 DIAGNOSIS — E611 Iron deficiency: Secondary | ICD-10-CM

## 2021-07-06 ENCOUNTER — Ambulatory Visit: Payer: 59 | Admitting: Obstetrics & Gynecology

## 2021-07-24 ENCOUNTER — Ambulatory Visit: Payer: 59 | Admitting: Obstetrics & Gynecology

## 2021-08-17 ENCOUNTER — Ambulatory Visit (INDEPENDENT_AMBULATORY_CARE_PROVIDER_SITE_OTHER): Payer: 59 | Admitting: Obstetrics & Gynecology

## 2021-08-17 ENCOUNTER — Other Ambulatory Visit (HOSPITAL_COMMUNITY)
Admission: RE | Admit: 2021-08-17 | Discharge: 2021-08-17 | Disposition: A | Discharge status: Home / Self Care | Payer: 59 | Source: Ambulatory Visit | Attending: Obstetrics & Gynecology | Admitting: Obstetrics & Gynecology

## 2021-08-17 ENCOUNTER — Encounter: Payer: Self-pay | Admitting: Obstetrics & Gynecology

## 2021-08-17 VITALS — BP 130/80 | HR 112 | Ht 66.0 in | Wt 183.2 lb

## 2021-08-17 DIAGNOSIS — Z01419 Encounter for gynecological examination (general) (routine) without abnormal findings: Secondary | ICD-10-CM | POA: Insufficient documentation

## 2021-08-17 DIAGNOSIS — Z0000 Encounter for general adult medical examination without abnormal findings: Secondary | ICD-10-CM | POA: Diagnosis not present

## 2021-08-17 NOTE — Progress Notes (Signed)
WELL-WOMAN EXAMINATION Patient name: Renee Bean MRN [id number]  Date of birth: 1980-06-28 Chief Complaint:   Gynecologic Exam  History of Present Illness:   Renee Bean is a 41 y.o. G0P0000 female being seen today for a routine well-woman exam.   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  Denies intermenstrual bleeding. Menses regular, no acute complaints   Patient's last menstrual period was 08/01/2021. Denies issues with her menses The current method of family planning is  declined at this time .    Last pap see above.  Last mammogram: 02/2021- neg. Last colonoscopy: n/a     08/17/2021    9:23 AM 05/28/2021    9:12 AM 10/29/2020   11:47 AM 08/13/2020    3:04 PM 03/21/2020   11:08 AM  Depression screen PHQ 2/9  Decreased Interest 0 0 0 0 0  Down, Depressed, Hopeless 0 0 0 0 0  PHQ - 2 Score 0 0 0 0 0  Altered sleeping 1    0  Tired, decreased energy 0    0  Change in appetite 0    0  Feeling bad or failure about yourself  0    0  Trouble concentrating 0    0  Moving slowly or fidgety/restless 0    0  Suicidal thoughts 0    0  PHQ-9 Score 1    0      Review of Systems:   Pertinent items are noted in HPI Denies any headaches, blurred vision, fatigue, shortness of breath, chest pain, abdominal pain, bowel movements, urination, or intercourse unless otherwise stated above.  Pertinent History Reviewed:  Reviewed past medical,surgical, social and family history.  Reviewed problem list, medications and allergies. Physical Assessment:   Vitals:   08/17/21 0919  BP: 130/80  Pulse: (!) 112  Weight: 183 lb 3.2 oz (83.1 kg)  Height: 5\' 6"  (1.676 m)  Body mass index is 29.57 kg/m.        Physical Examination:   General appearance - well appearing, and in no distress  Mental status - alert, oriented to person, place, and time  Psych:  She has a normal mood and affect  Skin - warm and dry, normal color, no  suspicious lesions noted  Chest - effort normal, all lung fields clear to auscultation bilaterally  Heart - normal rate and regular rhythm  Neck:  midline trachea, no thyromegaly or nodules  Breasts - breasts appear normal, no suspicious masses, no skin or nipple changes or  axillary nodes  Abdomen - soft, nontender, nondistended, no masses or organomegaly  Pelvic - VULVA: normal appearing vulva with no masses, tenderness or lesions  VAGINA: normal appearing vagina with normal color and discharge, no lesions  CERVIX: normal appearing cervix without discharge or lesions, no CMT  Thin prep pap is done with HR HPV cotesting  UTERUS: uterus is felt to be normal size, shape, consistency and nontender   ADNEXA: No adnexal masses or tenderness noted.  Extremities:  No swelling or varicosities noted  Chaperone:  pt declined      Assessment & Plan:  1) Well-Woman Exam -pap collected, due to prior dysplasia, next step pending results -mammogram up to date -may try to complete colonoscopy due to maternal colon ca @ 41yo- sees GI   Meds: No orders of the defined types were placed in this encounter.   Follow-up: Return in about 1 year (around 08/18/2022) for Annual.  Janyth Pupa, DO Attending [place], [facility] for [employer], [place]

## 2021-08-24 LAB — CYTOLOGY - PAP
Comment: NEGATIVE
Comment: NEGATIVE
Comment: NEGATIVE
HPV 16: NEGATIVE
HPV 18 / 45: NEGATIVE
High risk HPV: POSITIVE — AB

## 2021-09-07 ENCOUNTER — Ambulatory Visit: Payer: 59 | Admitting: Obstetrics & Gynecology

## 2021-09-07 ENCOUNTER — Encounter: Payer: Self-pay | Admitting: Obstetrics & Gynecology

## 2021-09-07 VITALS — BP 134/82 | HR 105 | Ht 66.0 in | Wt 186.0 lb

## 2021-09-07 DIAGNOSIS — R87612 Low grade squamous intraepithelial lesion on cytologic smear of cervix (LGSIL): Secondary | ICD-10-CM

## 2021-09-07 NOTE — Progress Notes (Signed)
   GYN VISIT Patient name: Renee Bean MRN [id number]  Date of birth: 02/27/1980 Chief Complaint:   Results  History of Present Illness:   Renee Bean is a 41 y.o. G0P0000 female being seen today for cervical dysplasia  -Recent pap 08/2021- LSIL, HPV+ Prior colposcopy 05/2020- CIN 1, pap 03/2020  Prior pap 2014 negative  Not sexually active- denies postcoital or intermenstrual bleeding.  Reports no acute gyn concerns.  Patient's last menstrual period was 08/01/2021.     08/17/2021    9:23 AM 05/28/2021    9:12 AM 10/29/2020   11:47 AM 08/13/2020    3:04 PM 03/21/2020   11:08 AM  Depression screen PHQ 2/9  Decreased Interest 0 0 0 0 0  Down, Depressed, Hopeless 0 0 0 0 0  PHQ - 2 Score 0 0 0 0 0  Altered sleeping 1    0  Tired, decreased energy 0    0  Change in appetite 0    0  Feeling bad or failure about yourself  0    0  Trouble concentrating 0    0  Moving slowly or fidgety/restless 0    0  Suicidal thoughts 0    0  PHQ-9 Score 1    0     Review of Systems:   Pertinent items are noted in HPI Denies fever/chills, dizziness, headaches, visual disturbances, fatigue, shortness of breath, chest pain, abdominal pain, vomiting, denies problems with periods, bowel movements, urination, or intercourse unless otherwise stated above.  Pertinent History Reviewed:  Reviewed past medical,surgical, social, obstetrical and family history.  Reviewed problem list, medications and allergies. Physical Assessment:   Vitals:   09/07/21 1008  BP: 134/82  Pulse: (!) 105  Weight: 186 lb (84.4 kg)  Height: 5\' 6"  (1.676 m)  Body mass index is 30.02 kg/m.       Physical Examination:   General appearance: alert, well appearing, and in no distress  Psych: mood appropriate, normal affect  Skin: warm & dry   Cardiovascular: normal heart rate noted  Respiratory: normal respiratory effort, no distress   Chaperone: N/A    Assessment & Plan:  1) Cervical dysplasia -Reviewed recent  pap, discussed low grade dysplasia and low risk of progression -discussed pros/cons of conservative management vs proceeding with LEEP/cervical ablation -Questions/concerns addressed -pt would prefer conservative management -reviewed importance of continued follow up  Return in about 1 year (around 09/08/2022) for Annual- pap.   09/10/2022, DO Attending Obstetrician & Gynecologist, [facility] for [facility]

## 2021-09-16 ENCOUNTER — Ambulatory Visit: Payer: 59 | Admitting: Podiatry

## 2021-09-22 ENCOUNTER — Other Ambulatory Visit: Payer: Self-pay | Admitting: Podiatry

## 2021-09-22 ENCOUNTER — Ambulatory Visit: Payer: 59 | Admitting: Podiatry

## 2021-09-22 DIAGNOSIS — B351 Tinea unguium: Secondary | ICD-10-CM | POA: Diagnosis not present

## 2021-09-22 MED ORDER — CICLOPIROX 8 % EX SOLN: Freq: Every day | CUTANEOUS | 3 refills | Status: DC

## 2021-09-22 NOTE — Progress Notes (Signed)
  Subjective:  Patient ID: Renee Bean, female    DOB: 01/10/1981,   MRN: [id number]  No chief complaint on file.   41 y.o. female presents for concern of bilateral nail fungus. Relates that the nails are thick and discolored yellow. She has tried a topical antifungal that did not help. Denies diabetes.  . Denies any other pedal complaints. Denies n/v/f/c.   No past medical history on file.  Objective:  Physical Exam: Vascular: DP/PT pulses 2/4 bilateral. CFT <3 seconds. Normal hair growth on digits. No edema.  Skin. No lacerations or abrasions bilateral feet. Bilateral hallux nails as well as second and third digits are thickened and with some discoloration.  Musculoskeletal: MMT 5/5 bilateral lower extremities in DF, PF, Inversion and Eversion. Deceased ROM in DF of ankle joint.  Neurological: Sensation intact to light touch.   Assessment:   1. Onychomycosis       Plan:  Patient was evaluated and treated and all questions answered. -Examined patient -Discussed treatment options for painful dystrophic nails  -Discussed fungal nail treatment options including oral, topical, and laser treatments.  -Prescription for penlac refilled -Patient to return in 3 months for re-check   Louann Sjogren, DPM

## 2021-09-22 NOTE — Telephone Encounter (Signed)
Please advise 

## 2021-10-06 ENCOUNTER — Ambulatory Visit: Payer: 59 | Admitting: Podiatry

## 2021-10-09 ENCOUNTER — Inpatient Hospital Stay: Payer: 59 | Attending: Hematology

## 2021-10-09 DIAGNOSIS — E611 Iron deficiency: Secondary | ICD-10-CM | POA: Insufficient documentation

## 2021-10-09 DIAGNOSIS — D75839 Thrombocytosis, unspecified: Secondary | ICD-10-CM | POA: Insufficient documentation

## 2021-10-09 LAB — CBC WITH DIFFERENTIAL/PLATELET
Abs Immature Granulocytes: 0.02 10*3/uL (ref 0.00–0.07)
Basophils Absolute: 0.1 10*3/uL (ref 0.0–0.1)
Basophils Relative: 1 %
Eosinophils Absolute: 0.2 10*3/uL (ref 0.0–0.5)
Eosinophils Relative: 3 %
HCT: 40.2 % (ref 36.0–46.0)
Hemoglobin: 12.6 g/dL (ref 12.0–15.0)
Immature Granulocytes: 0 %
Lymphocytes Relative: 25 %
Lymphs Abs: 1.4 10*3/uL (ref 0.7–4.0)
MCH: 25.9 pg — ABNORMAL LOW (ref 26.0–34.0)
MCHC: 31.3 g/dL (ref 30.0–36.0)
MCV: 82.5 fL (ref 80.0–100.0)
Monocytes Absolute: 0.5 10*3/uL (ref 0.1–1.0)
Monocytes Relative: 8 %
Neutro Abs: 3.5 10*3/uL (ref 1.7–7.7)
Neutrophils Relative %: 63 %
Platelets: 485 10*3/uL — ABNORMAL HIGH (ref 150–400)
RBC: 4.87 MIL/uL (ref 3.87–5.11)
RDW: 13.6 % (ref 11.5–15.5)
WBC: 5.6 10*3/uL (ref 4.0–10.5)
nRBC: 0 % (ref 0.0–0.2)

## 2021-10-09 LAB — IRON AND TIBC
Iron: 123 ug/dL (ref 28–170)
Saturation Ratios: 30 % (ref 10.4–31.8)
TIBC: 404 ug/dL (ref 250–450)
UIBC: 281 ug/dL

## 2021-10-09 LAB — FERRITIN: Ferritin: 10 ng/mL — ABNORMAL LOW (ref 11–307)

## 2021-10-16 ENCOUNTER — Inpatient Hospital Stay: Payer: 59 | Attending: Physician Assistant | Admitting: Physician Assistant

## 2021-10-16 ENCOUNTER — Encounter: Payer: Self-pay | Admitting: Physician Assistant

## 2021-10-16 DIAGNOSIS — D500 Iron deficiency anemia secondary to blood loss (chronic): Secondary | ICD-10-CM

## 2021-10-16 NOTE — Progress Notes (Signed)
Virtual Visit via Telephone Note [facility]  I connected with Renee Bean  on 10/16/21 at  1:45 PM by telephone and verified that I am speaking with the correct person using two identifiers.  Location: Patient: Home Provider: [facility]   I discussed the limitations, risks, security and privacy concerns of performing an evaluation and management service by telephone and the availability of in person appointments. I also discussed with the patient that there may be a patient responsible charge related to this service. The patient expressed understanding and agreed to proceed.  RREASON FOR VISIT:  Follow-up for thrombocytosis and iron deficiency   PRIOR THERAPY: None   CURRENT THERAPY: Oral iron supplement   INTERVAL HISTORY:  Renee Bean 41 y.o. female is contacted today for routine follow-up of her thrombocytosis and iron deficiency.  She was last seen by Tarri Abernethy PA-C on 07/03/2021.   At today's visit, she reports feeling fairly well.  She has been taking iron tablet daily with some improvement in her energy.  She continues to report intermittent rectal bleeding and regular blood loss from menstrual periods. She continues to report occasional tinnitus but denies any dizziness, blurry vision, strokelike symptoms, or neuropathy.  No aquagenic pruritus or Raynaud's phenomenon.  No B symptoms. She admits to mild fatigue, restless legs, and headaches.  She denies any pica.  She has 75% energy and 100% appetite. She endorses that she is maintaining a stable weight.    OBSERVATIONS/OBJECTIVE: Review of Systems  Constitutional:  Positive for malaise/fatigue (Mild fatigue). Negative for chills, diaphoresis, fever and weight loss.  Respiratory:  Negative for cough and shortness of breath.   Cardiovascular:  Negative for chest pain and palpitations.  Gastrointestinal:  Negative for abdominal pain, blood in stool, melena, nausea and vomiting.   Neurological:  Positive for headaches. Negative for dizziness.     PHYSICAL EXAM (per limitations of virtual telephone visit): The patient is alert and oriented x 3, exhibiting adequate mentation, good mood, and ability to speak in full sentences and execute sound judgement.   ASSESSMENT & PLAN: 1.  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 - Review of past labs shows mildly elevated platelets since 2012, ranging from 419-534.  Most recent CBC (05/26/2021) with platelets 474; normal Hgb 12.2 with mild microcytosis and MCV 79; normal WBC and differential. - Rectal bleeding few times each month, planning for colonoscopy with Dr. Jenetta Downer, which was delayed due to change in insurance. - Regular blood loss from menstrual periods. - No history of DVT, PE, MI, or CVA. - Symptomatic with tinnitus for 2-3 months.  No erythromelalgia, aquagenic pruritus, or other vasomotor symptoms.   - Lifelong non-smoker.  No autoimmune, inflammatory, or connective tissue disorder. - Up-to-date on annual mammograms. - Family history significant for mother diagnosed with colon cancer at age 30. - Hematology work-up (06/18/2021): JAK2 with reflex to CALR/MPL was negative.  Negative BCR/ABL FISH Normal CRP, ESR, ANA, RF Ferritin 8, iron saturation 7%, TIBC 434 - She has been taking iron tablet daily since June 2023 - Most recent labs (10/09/2021): Hgb 12.6/MCV 82.5.  Platelets 485.  Ferritin 10, iron saturation 30%  - PLAN: Failed to improve on oral iron supplementation. - Recommend IV iron with Venofer 300 mg x 3.  We discussed risks of allergic reaction as well as common side effects to IV iron.  Patient is agreeable to  proceed. - Agree with colonoscopy per Dr. Jenetta Downer, due to history of rectal bleeding and mother's history of colon cancer diagnosed at age 17.  Patient is planning to reschedule her  colonoscopy for later this year, pending insurance change. - Repeat labs and RTC in 3 months with PHONE visit   2.  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  She denies any tobacco, alcohol, or illicit drug use. - FAMILY:  Family history is significant for mother with colon cancer diagnosed at age 1, paternal aunt with brain tumor, and paternal aunt with breast cancer     FOLLOW UP INSTRUCTIONS: Venofer 300 mg x 3 Labs in 3 months Phone visit after labs    I discussed the assessment and treatment plan with the patient. The patient was provided an opportunity to ask questions and all were answered. The patient agreed with the plan and demonstrated an understanding of the instructions.   The patient was advised to call back or seek an in-person evaluation if the symptoms worsen or if the condition fails to improve as anticipated.  I provided 22 minutes of non-face-to-face time during this encounter.   Harriett Rush, PA-C 10/16/2021 6:02 PM

## 2021-10-19 ENCOUNTER — Other Ambulatory Visit: Payer: Self-pay | Admitting: Physician Assistant

## 2022-02-04 ENCOUNTER — Other Ambulatory Visit (HOSPITAL_COMMUNITY): Payer: Self-pay | Admitting: Obstetrics & Gynecology

## 2022-02-04 DIAGNOSIS — Z1231 Encounter for screening mammogram for malignant neoplasm of breast: Secondary | ICD-10-CM

## 2022-02-12 ENCOUNTER — Ambulatory Visit (HOSPITAL_COMMUNITY)
Admission: RE | Admit: 2022-02-12 | Discharge: 2022-02-12 | Disposition: A | Discharge status: Home / Self Care | Payer: 59 | Source: Ambulatory Visit | Attending: Obstetrics & Gynecology | Admitting: Obstetrics & Gynecology

## 2022-02-12 DIAGNOSIS — Z1231 Encounter for screening mammogram for malignant neoplasm of breast: Secondary | ICD-10-CM | POA: Diagnosis not present

## 2022-02-17 ENCOUNTER — Other Ambulatory Visit (HOSPITAL_COMMUNITY): Payer: Self-pay | Admitting: Obstetrics & Gynecology

## 2022-02-17 DIAGNOSIS — R928 Other abnormal and inconclusive findings on diagnostic imaging of breast: Secondary | ICD-10-CM

## 2022-02-23 ENCOUNTER — Other Ambulatory Visit (HOSPITAL_COMMUNITY): Payer: Self-pay | Admitting: Obstetrics & Gynecology

## 2022-02-23 ENCOUNTER — Ambulatory Visit (HOSPITAL_COMMUNITY)
Admission: RE | Admit: 2022-02-23 | Discharge: 2022-02-23 | Disposition: A | Discharge status: Home / Self Care | Payer: 59 | Source: Ambulatory Visit | Attending: Obstetrics & Gynecology | Admitting: Obstetrics & Gynecology

## 2022-02-23 DIAGNOSIS — R922 Inconclusive mammogram: Secondary | ICD-10-CM | POA: Diagnosis not present

## 2022-02-23 DIAGNOSIS — R928 Other abnormal and inconclusive findings on diagnostic imaging of breast: Secondary | ICD-10-CM | POA: Insufficient documentation

## 2022-02-23 DIAGNOSIS — N6323 Unspecified lump in the left breast, lower outer quadrant: Secondary | ICD-10-CM | POA: Diagnosis not present

## 2022-03-08 ENCOUNTER — Other Ambulatory Visit (HOSPITAL_COMMUNITY): Payer: Self-pay | Admitting: Obstetrics & Gynecology

## 2022-03-08 DIAGNOSIS — R928 Other abnormal and inconclusive findings on diagnostic imaging of breast: Secondary | ICD-10-CM

## 2022-03-09 ENCOUNTER — Encounter (HOSPITAL_COMMUNITY): Payer: Self-pay

## 2022-03-09 ENCOUNTER — Other Ambulatory Visit (HOSPITAL_COMMUNITY): Payer: Self-pay | Admitting: Obstetrics & Gynecology

## 2022-03-09 ENCOUNTER — Ambulatory Visit (HOSPITAL_COMMUNITY)
Admission: RE | Admit: 2022-03-09 | Discharge: 2022-03-09 | Disposition: A | Discharge status: Home / Self Care | Payer: 59 | Source: Ambulatory Visit | Attending: Obstetrics & Gynecology | Admitting: Obstetrics & Gynecology

## 2022-03-09 DIAGNOSIS — R928 Other abnormal and inconclusive findings on diagnostic imaging of breast: Secondary | ICD-10-CM

## 2022-03-09 DIAGNOSIS — N6489 Other specified disorders of breast: Secondary | ICD-10-CM | POA: Diagnosis not present

## 2022-03-09 MED ORDER — LIDOCAINE HCL (PF) 2 % IJ SOLN
INTRAMUSCULAR | Status: AC
  Filled 2022-03-09: qty 10

## 2022-03-09 MED ORDER — LIDOCAINE-EPINEPHRINE (PF) 1 %-1:200000 IJ SOLN: 10.0000 mL | Freq: Once | INTRAMUSCULAR | Status: DC

## 2022-03-10 ENCOUNTER — Other Ambulatory Visit (HOSPITAL_COMMUNITY): Payer: Self-pay | Admitting: Internal Medicine

## 2022-03-10 DIAGNOSIS — R928 Other abnormal and inconclusive findings on diagnostic imaging of breast: Secondary | ICD-10-CM

## 2022-03-18 ENCOUNTER — Ambulatory Visit
Admission: RE | Admit: 2022-03-18 | Discharge: 2022-03-18 | Disposition: A | Discharge status: Home / Self Care | Payer: 59 | Source: Ambulatory Visit | Attending: Internal Medicine | Admitting: Internal Medicine

## 2022-03-18 DIAGNOSIS — R928 Other abnormal and inconclusive findings on diagnostic imaging of breast: Secondary | ICD-10-CM

## 2022-03-18 HISTORY — PX: BREAST BIOPSY: SHX20

## 2022-04-05 ENCOUNTER — Ambulatory Visit: Payer: Self-pay | Admitting: Surgery

## 2022-04-05 DIAGNOSIS — N6489 Other specified disorders of breast: Secondary | ICD-10-CM | POA: Diagnosis not present

## 2022-04-05 NOTE — H&P (Signed)
Subjective   Chief Complaint: New Consultation     History of Present Illness: Renee Bean is a 42 y.o. female who is seen today as an office consultation at the request of Dr. Doren Custard for evaluation of New Consultation .   This is a healthy 42 year old female who presents after recent routine screening mammogram revealed an area of asymmetry in the left lower outer quadrant.  She underwent further workup including ultrasound and stereotactic biopsy.  She has a 1.2 x 0.8 x 1.1 cm area located at 330 in the left breast 4 cm from the nipple.  She underwent stereotactic biopsy of this area that revealed a complex sclerosing lesion.  No sign of malignancy.  No family history of breast cancer.  She presents now to discuss excision.   Review of Systems: A complete review of systems was obtained from the patient.  I have reviewed this information and discussed as appropriate with the patient.  See HPI as well for other ROS.  Review of Systems  Constitutional: Negative.   HENT:  Positive for tinnitus.   Eyes: Negative.   Respiratory: Negative.    Cardiovascular: Negative.   Gastrointestinal:  Positive for heartburn.  Genitourinary: Negative.   Musculoskeletal: Negative.   Skin: Negative.   Neurological: Negative.   Endo/Heme/Allergies: Negative.   Psychiatric/Behavioral:  The patient is nervous/anxious.       Medical History: Past Medical History:  Diagnosis Date   Anemia    Anxiety     Patient Active Problem List  Diagnosis   Complex sclerosing lesion of left breast   Family history of colorectal cancer   HLD (hyperlipidemia)   Iron deficiency anemia due to chronic blood loss   Overweight   Tinnitus of both ears    History reviewed. No pertinent surgical history.   No Known Allergies  No current outpatient medications on file prior to visit.   No current facility-administered medications on file prior to visit.    Family History  Problem Relation Age of Onset    High blood pressure (Hypertension) Mother    Colon cancer Mother    High blood pressure (Hypertension) Father    Hyperlipidemia (Elevated cholesterol) Father    Diabetes Father      Social History   Tobacco Use  Smoking Status Never  Smokeless Tobacco Never     Social History   Socioeconomic History   Marital status: Single  Tobacco Use   Smoking status: Never   Smokeless tobacco: Never  Substance and Sexual Activity   Alcohol use: Not Currently   Drug use: Never    Objective:    Vitals:   04/05/22 1007  BP: 138/81  Pulse: 108  Temp: 36.9 C (98.4 F)  SpO2: 93%  Weight: 83.5 kg (184 lb)  Height: 170.2 cm (5\' 7" )    Body mass index is 28.82 kg/m.  Physical Exam   Constitutional:  WDWN in NAD, conversant, no obvious deformities; lying in bed comfortably Eyes:  Pupils equal, round; sclera anicteric; moist conjunctiva; no lid lag HENT:  Oral mucosa moist; good dentition  Neck:  No masses palpated, trachea midline; no thyromegaly Lungs:  CTA bilaterally; normal respiratory effort Breasts:  symmetric, no nipple changes; no palpable masses or lymphadenopathy on either side CV:  Regular rate and rhythm; no murmurs; extremities well-perfused with no edema Abd:  +bowel sounds, soft, non-tender, no palpable organomegaly; no palpable hernias Musc: Normal gait; no apparent clubbing or cyanosis in extremities Lymphatic:  No palpable cervical or  axillary lymphadenopathy Skin:  Warm, dry; no sign of jaundice Psychiatric - alert and oriented x 4; calm mood and affect   Labs, Imaging and Diagnostic Testing: Breast, left, needle core biopsy, lower outer FRAGMENTS OF A COMPLEX SCLEROSING LESION WITH FIBROCYSTIC CHANGES INCLUDING CYSTIC DILATATION OF DUCTS, APOCRINE METAPLASIA AND ADENOSIS NEGATIVE FOR MICROCALCIFICATIONS NEGATIVE FOR ATYPIA AND CARCINOMA Diagnosis Note Diagnosis called to [facility] at [facility] by Dr. Alric Seton on 03/19/2022  at 9:18 AM. Tobin Chad MD Pathologist, Electronic Signature (Case signed 03/19/2022)  CLINICAL DATA:  Recall from screening mammography, possible architectural distortion involving the retroareolar LEFT breast at posterior depth visible only on the MLO view.   EXAM: DIGITAL DIAGNOSTIC UNILATERAL LEFT MAMMOGRAM WITH TOMOSYNTHESIS; ULTRASOUND LEFT BREAST LIMITED   TECHNIQUE: Left digital diagnostic mammography and breast tomosynthesis was performed.; Targeted ultrasound examination of the left breast was performed.   COMPARISON:  Previous exam(s).   ACR Breast Density Category c: The breasts are heterogeneously dense, which may obscure small masses.   FINDINGS: Spot compression MLO view of the area of concern, full field mediolateral and laterally exaggerated CC views, and a spot compression laterally exaggerated CC view were obtained.   There is persistent subtle architectural distortion on the spot compression MLO view. There is no associated mass or suspicious calcifications. On the laterally exaggerated CC view, the distortion localizes to the outer breast. On the spot compression laterally exaggerated CC view, the architectural distortion persists.   Targeted ultrasound is performed, demonstrating an irregular hypoechoic focus/mass at the 3:30 o'clock position 4 cm from nipple at middle depth measuring approximately 1.2 x 0.8 x 1.1 cm, demonstrating no posterior characteristics and no internal power Doppler flow, possibly associated with subtle architectural distortion and likely the sonographic correlate for the mammographic distortion.   Sonographic evaluation of the LEFT axilla demonstrates no pathologic lymphadenopathy.   IMPRESSION: 1. 1.2 cm hypoechoic focus/mass in the outer LEFT breast at 3:30 o'clock 4 cm from the nipple which is the likely correlate for the mammographic distortion. 2. No pathologic LEFT axillary lymphadenopathy.    RECOMMENDATION: Ultrasound-guided core needle biopsy of the LEFT breast mass.   If the sonographic mass is not the correlate for the mammographic distortion, then stereotactic tomosynthesis core needle biopsy of the distortion may be required.   I have discussed the findings and recommendations with the patient. The ultrasound core needle biopsy procedure was discussed with the patient and her questions were answered. She wishes to proceed with the biopsy which has been scheduled by the Breast Imaging staff.   BI-RADS CATEGORY  4: Suspicious.     Electronically Signed   By: Evangeline Dakin M.D.   On: 02/23/2022 10:51   Assessment and Plan:  Diagnoses and all orders for this visit:  Complex sclerosing lesion of left breast    Recommend left breast radioactive seed localized lumpectomy.The surgical procedure has been discussed with the patient.  Potential risks, benefits, alternative treatments, and expected outcomes have been explained.  All of the patient's questions at this time have been answered.  The likelihood of reaching the patient's treatment goal is good.  The patient understand the proposed surgical procedure and wishes to proceed.    Mischele Detter Jearld Adjutant, MD  04/05/2022 12:22 PM

## 2022-04-07 ENCOUNTER — Other Ambulatory Visit: Payer: Self-pay | Admitting: Surgery

## 2022-04-07 DIAGNOSIS — N6489 Other specified disorders of breast: Secondary | ICD-10-CM

## 2022-05-19 ENCOUNTER — Encounter (HOSPITAL_BASED_OUTPATIENT_CLINIC_OR_DEPARTMENT_OTHER): Payer: Self-pay

## 2022-05-19 ENCOUNTER — Ambulatory Visit (HOSPITAL_BASED_OUTPATIENT_CLINIC_OR_DEPARTMENT_OTHER): Admit: 2022-05-19 | Payer: 59 | Admitting: Surgery

## 2022-05-19 SURGERY — BREAST LUMPECTOMY WITH RADIOACTIVE SEED LOCALIZATION
Anesthesia: General | Site: Breast | Laterality: Left

## 2022-06-01 ENCOUNTER — Encounter: Payer: 59 | Admitting: Internal Medicine

## 2022-06-11 ENCOUNTER — Encounter: Payer: 59 | Admitting: Internal Medicine

## 2022-07-01 ENCOUNTER — Encounter: Payer: 59 | Admitting: Internal Medicine

## 2022-08-08 ENCOUNTER — Encounter: Payer: Self-pay | Admitting: Hematology

## 2022-08-10 ENCOUNTER — Other Ambulatory Visit: Payer: Self-pay

## 2022-08-10 DIAGNOSIS — E7800 Pure hypercholesterolemia, unspecified: Secondary | ICD-10-CM | POA: Diagnosis not present

## 2022-08-10 DIAGNOSIS — E663 Overweight: Secondary | ICD-10-CM

## 2022-08-10 DIAGNOSIS — Z139 Encounter for screening, unspecified: Secondary | ICD-10-CM

## 2022-08-10 DIAGNOSIS — Z0000 Encounter for general adult medical examination without abnormal findings: Secondary | ICD-10-CM

## 2022-08-10 DIAGNOSIS — D75839 Thrombocytosis, unspecified: Secondary | ICD-10-CM

## 2022-08-12 ENCOUNTER — Encounter: Payer: Self-pay | Admitting: Internal Medicine

## 2022-08-12 ENCOUNTER — Ambulatory Visit (INDEPENDENT_AMBULATORY_CARE_PROVIDER_SITE_OTHER): Payer: 59 | Admitting: Internal Medicine

## 2022-08-12 VITALS — BP 139/75 | HR 103 | Ht 67.0 in | Wt 185.4 lb

## 2022-08-12 DIAGNOSIS — E782 Mixed hyperlipidemia: Secondary | ICD-10-CM

## 2022-08-12 DIAGNOSIS — Z0001 Encounter for general adult medical examination with abnormal findings: Secondary | ICD-10-CM

## 2022-08-12 DIAGNOSIS — D500 Iron deficiency anemia secondary to blood loss (chronic): Secondary | ICD-10-CM

## 2022-08-12 DIAGNOSIS — E559 Vitamin D deficiency, unspecified: Secondary | ICD-10-CM

## 2022-08-12 NOTE — Progress Notes (Signed)
Complete physical exam  Patient: Renee Bean   DOB: 1980/11/17   42 y.o. Female  MRN: [id number]  Subjective:    Chief Complaint  Patient presents with   Annual Exam    Renee Bean is a 42 y.o. female who presents today for a complete physical exam. She reports consuming a general diet. Home exercise routine includes stationary bike and weights at home. She generally feels fairly well. She reports sleeping poorly. She does not have additional problems to discuss today.    Most recent fall risk assessment:    08/12/2022    3:05 PM  Fall Risk   Falls in the past year? 0  Number falls in past yr: 0  Injury with Fall? 0  Risk for fall due to : No Fall Risks  Follow up Falls evaluation completed     Most recent depression screenings:    08/12/2022    3:05 PM 08/17/2021    9:23 AM  PHQ 2/9 Scores  PHQ - 2 Score 0 0  PHQ- 9 Score  1    Vision:Not within last year  and Dental: No current dental problems and No regular dental care   History reviewed. No pertinent past medical history. Past Surgical History:  Procedure Laterality Date   BREAST BIOPSY Left 03/18/2022   MM LT BREAST BX W LOC DEV 1ST LESION IMAGE BX SPEC STEREO GUIDE 03/18/2022 [facility]   NO PAST SURGERIES     Social History   Tobacco Use   Smoking status: Never   Smokeless tobacco: Never  Vaping Use   Vaping status: Never Used  Substance Use Topics   Alcohol use: No   Drug use: No   Family History  Problem Relation Age of Onset   Cancer Mother 59       colon   Hypertension Mother    Hypertension Father    Diabetes Father    High Cholesterol Father    High Cholesterol Sister    Hypertension Maternal Grandmother    Diabetes Maternal Grandmother    Hypertension Maternal Grandfather    Diabetes Maternal Grandfather    Hypertension Paternal Grandmother    Diabetes Paternal Grandmother    Hypertension Paternal Grandfather    Diabetes Paternal Grandfather    Breast cancer Neg Hx     Cervical cancer Neg Hx    No Known Allergies    Patient Care Team: Billie Lade, MD as PCP - General (Internal Medicine)   Outpatient Medications Prior to Visit  Medication Sig   ciclopirox (PENLAC) 8 % solution APPLY TOPICALLY AT BEDTIME. APPLY OVER NAIL AND SURROUNDING SKIN. APPLY DAILY OVER PREVIOUS COAT. AFTER SEVEN (7) DAYS, MAY REMOVE WITH ALCOHOL AND CONTINUE CYCLE.   No facility-administered medications prior to visit.   Review of Systems  Constitutional:  Negative for chills and fever.  HENT:  Negative for sore throat.   Respiratory:  Negative for cough and shortness of breath.   Cardiovascular:  Negative for chest pain, palpitations and leg swelling.  Gastrointestinal:  Negative for abdominal pain, blood in stool, constipation, diarrhea, nausea and vomiting.  Genitourinary:  Negative for dysuria and hematuria.  Musculoskeletal:  Negative for myalgias.  Skin:  Negative for itching and rash.  Neurological:  Negative for dizziness and headaches.  Psychiatric/Behavioral:  Negative for depression and suicidal ideas.       Objective:     BP 139/75   Pulse (!) 103   Ht 5\' 7"  (1.702 m)  Wt 185 lb 6.4 oz (84.1 kg)   SpO2 98%   BMI 29.04 kg/m  BP Readings from Last 3 Encounters:  08/12/22 139/75  03/09/22 127/85  09/07/21 134/82   Physical Exam Vitals reviewed.  Constitutional:      General: She is not in acute distress.    Appearance: Normal appearance. She is not toxic-appearing.  HENT:     Head: Normocephalic and atraumatic.     Right Ear: External ear normal.     Left Ear: External ear normal.     Nose: Nose normal. No congestion or rhinorrhea.     Mouth/Throat:     Mouth: Mucous membranes are moist.     Pharynx: Oropharynx is clear. No oropharyngeal exudate or posterior oropharyngeal erythema.  Eyes:     General: No scleral icterus.    Extraocular Movements: Extraocular movements intact.     Conjunctiva/sclera: Conjunctivae normal.     Pupils:  Pupils are equal, round, and reactive to light.  Cardiovascular:     Rate and Rhythm: Normal rate and regular rhythm.     Pulses: Normal pulses.     Heart sounds: Normal heart sounds. No murmur heard.    No friction rub. No gallop.  Pulmonary:     Effort: Pulmonary effort is normal.     Breath sounds: Normal breath sounds. No wheezing, rhonchi or rales.  Abdominal:     General: Abdomen is flat. Bowel sounds are normal. There is no distension.     Palpations: Abdomen is soft.     Tenderness: There is no abdominal tenderness.  Musculoskeletal:        General: No swelling. Normal range of motion.     Cervical back: Normal range of motion.     Right lower leg: No edema.     Left lower leg: No edema.  Lymphadenopathy:     Cervical: No cervical adenopathy.  Skin:    General: Skin is warm and dry.     Capillary Refill: Capillary refill takes less than 2 seconds.     Coloration: Skin is not jaundiced.  Neurological:     General: No focal deficit present.     Mental Status: She is alert and oriented to person, place, and time.  Psychiatric:        Mood and Affect: Mood normal.        Behavior: Behavior normal.   Last CBC Lab Results  Component Value Date   WBC 7.1 08/10/2022   HGB 11.8 08/10/2022   HCT 37.0 08/10/2022   MCV 79 08/10/2022   MCH 25.1 (L) 08/10/2022   RDW 13.3 08/10/2022   PLT 490 (H) 08/10/2022   Last metabolic panel Lab Results  Component Value Date   GLUCOSE 90 08/10/2022   NA 139 08/10/2022   K 4.8 08/10/2022   CL 103 08/10/2022   CO2 21 08/10/2022   BUN 9 08/10/2022   CREATININE 0.73 08/10/2022   EGFR 104 08/10/2022   CALCIUM 9.9 08/10/2022   PROT 7.3 08/10/2022   ALBUMIN 4.6 08/10/2022   LABGLOB 2.7 08/10/2022   AGRATIO 1.4 05/26/2021   BILITOT 0.3 08/10/2022   ALKPHOS 87 08/10/2022   AST 14 08/10/2022   ALT 11 08/10/2022   ANIONGAP 8 09/03/2014   Last lipids Lab Results  Component Value Date   CHOL 201 (H) 08/10/2022   HDL 51 08/10/2022    LDLCALC 134 (H) 08/10/2022   TRIG 90 08/10/2022   CHOLHDL 3.9 08/10/2022   Last hemoglobin A1c  Lab Results  Component Value Date   HGBA1C 5.2 08/10/2022   Last thyroid functions Lab Results  Component Value Date   TSH 1.160 05/28/2021   Last vitamin D Lab Results  Component Value Date   VD25OH 27.3 (L) 08/10/2022   Last vitamin B12 and Folate Lab Results  Component Value Date   VITAMINB12 454 08/10/2022   FOLATE 13.3 02/23/2010       Assessment & Plan:    Routine Health Maintenance and Physical Exam  Immunization History  Administered Date(s) Administered   PFIZER(Purple Top)SARS-COV-2 Vaccination 09/06/2019, 09/27/2019   Td 09/23/2008   Tdap 01/24/2020    Health Maintenance  Topic Date Due   INFLUENZA VACCINE  08/12/2022   PAP SMEAR-Modifier  08/17/2024   DTaP/Tdap/Td (3 - Td or Tdap) 01/23/2030   Hepatitis C Screening  Completed   HIV Screening  Completed   HPV VACCINES  Aged Out   COVID-19 Vaccine  Discontinued    Discussed health benefits of physical activity, and encouraged her to engage in regular exercise appropriate for her age and condition.  Problem List Items Addressed This Visit       HLD (hyperlipidemia)    Lipid panel updated earlier this week.  Total cholesterol 201 and LDL 134.  These values have increased over the last year. -Lifestyle modifications aimed at improving her cholesterol were reviewed again today.  I have recommended the Mediterranean diet.      Iron deficiency anemia due to chronic blood loss    Followed by hematology.  Not currently taking oral iron supplementation.  Thrombocytosis noted on CBC, likely reflective of persistent iron deficiency.  States that she was previously told she would need iron infusions.  She plans to contact hematology to arrange this.      Encounter for well adult exam with abnormal findings - Primary    Annual physical exam completed today.   -Recent records and labs have been reviewed. -Due  for influenza vaccination this fall.  Preventative care items are otherwise up-to-date. -We will send early plan for follow-up in 1 year for annual exam      Vitamin D insufficiency    Noted on recent labs.  Daily vitamin D supplementation recommended.      Return in about 1 year (around 08/12/2023) for CPE.  Billie Lade, MD

## 2022-08-12 NOTE — Assessment & Plan Note (Signed)
Noted on recent labs.  Daily vitamin D supplementation recommended. 

## 2022-08-12 NOTE — Assessment & Plan Note (Signed)
Annual physical exam completed today.   -Recent records and labs have been reviewed. -Due for influenza vaccination this fall.  Preventative care items are otherwise up-to-date. -We will send early plan for follow-up in 1 year for annual exam

## 2022-08-12 NOTE — Assessment & Plan Note (Signed)
Followed by hematology.  Not currently taking oral iron supplementation.  Thrombocytosis noted on CBC, likely reflective of persistent iron deficiency.  States that she was previously told she would need iron infusions.  She plans to contact hematology to arrange this.

## 2022-08-12 NOTE — Patient Instructions (Signed)
It was a pleasure to see you today.  Thank you for giving [place] the opportunity to be involved in your care.  Below is a brief recap of your visit and next steps.  We will plan to see you again in 1 year.  Summary Annual exam completed today We will plan for follow up in 1 year

## 2022-08-12 NOTE — Assessment & Plan Note (Signed)
Lipid panel updated earlier this week.  Total cholesterol 201 and LDL 134.  These values have increased over the last year. -Lifestyle modifications aimed at improving her cholesterol were reviewed again today.  I have recommended the Mediterranean diet.

## 2022-09-08 ENCOUNTER — Ambulatory Visit (INDEPENDENT_AMBULATORY_CARE_PROVIDER_SITE_OTHER): Payer: 59 | Admitting: Obstetrics & Gynecology

## 2022-09-08 ENCOUNTER — Encounter: Payer: Self-pay | Admitting: Obstetrics & Gynecology

## 2022-09-08 ENCOUNTER — Other Ambulatory Visit (HOSPITAL_COMMUNITY)
Admission: RE | Admit: 2022-09-08 | Discharge: 2022-09-08 | Disposition: A | Discharge status: Home / Self Care | Payer: 59 | Source: Ambulatory Visit | Attending: Obstetrics & Gynecology | Admitting: Obstetrics & Gynecology

## 2022-09-08 VITALS — BP 123/78 | HR 93 | Ht 66.0 in | Wt 185.4 lb

## 2022-09-08 DIAGNOSIS — Z01419 Encounter for gynecological examination (general) (routine) without abnormal findings: Secondary | ICD-10-CM

## 2022-09-08 NOTE — Addendum Note (Signed)
Addended by: Annamarie Dawley on: 09/08/2022 10:46 AM   Modules accepted: Orders

## 2022-09-08 NOTE — Progress Notes (Signed)
WELL-WOMAN EXAMINATION Patient name: Renee Bean MRN [id number]  Date of birth: 09/13/1980 Chief Complaint:   Annual Exam  History of Present Illness:   Renee Bean is a 42 y.o. G0P0000 female being seen today for a routine well-woman exam.   Menses typically every month about the same time.  Menses about 6-7 days, moderate bleeding.   Denies discharge, itching or irritation.  Denies pelvic or abdominal pain.  She reports no acute GYN concerns.  Recently underwent left breast biopsy-noted complex sclerosing lesion.  Patient plans for surgical removal with Dr. Corliss Skains  Patient's last menstrual period was 08/23/2022.  The current method of family planning is abstinence.    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 Last colonoscopy: NA     09/08/2022    9:32 AM 08/12/2022    3:05 PM 08/17/2021    9:23 AM 05/28/2021    9:12 AM 10/29/2020   11:47 AM  Depression screen PHQ 2/9  Decreased Interest 0 0 0 0 0  Down, Depressed, Hopeless 0 0 0 0 0  PHQ - 2 Score 0 0 0 0 0  Altered sleeping 1  1    Tired, decreased energy 0  0    Change in appetite 0  0    Feeling bad or failure about yourself  0  0    Trouble concentrating 0  0    Moving slowly or fidgety/restless 0  0    Suicidal thoughts 0  0    PHQ-9 Score 1  1        Review of Systems:   Pertinent items are noted in HPI Denies any headaches, blurred vision, fatigue, shortness of breath, chest pain, abdominal pain, bowel movements, urination, or intercourse unless otherwise stated above.  Pertinent History Reviewed:  Reviewed past medical,surgical, social and family history.  Reviewed problem list, medications and allergies. Physical Assessment:   Vitals:   09/08/22 0934  BP: 123/78  Pulse: 93  Weight: 185 lb 6.4 oz (84.1 kg)  Height: 5\' 6"  (1.676 m)  Body mass index is 29.92 kg/m.        Physical Examination:   General appearance - well appearing, and in no  distress  Mental status - alert, oriented to person, place, and time  Psych:  She has a normal mood and affect  Skin - warm and dry, normal color, no suspicious lesions noted  Chest - effort normal, all lung fields clear to auscultation bilaterally  Heart - normal rate and regular rhythm  Neck:  midline trachea, no thyromegaly or nodules  Breasts - breasts appear normal, no suspicious masses- abnormalities not appreciated on exam, no skin or nipple changes or  axillary nodes  Abdomen - soft, nontender, nondistended, no masses or organomegaly  Pelvic - VULVA: normal appearing vulva with no masses, tenderness or lesions  VAGINA: normal appearing vagina with normal color and discharge, no lesions  CERVIX: normal appearing cervix without discharge or lesions, no CMT  Thin prep pap is done with HR HPV cotesting  UTERUS: uterus is felt to be normal size, shape, consistency and nontender   ADNEXA: No adnexal masses or tenderness noted.  Extremities:  No swelling or varicosities noted  Chaperone: Faith Rogue     Assessment & Plan:  1) Well-Woman Exam Low grade dysplasia -pap collected, reviewed ASCCP guidelines   Meds: No orders of the defined types were placed in this encounter.   Follow-up: Return in about  1 year (around 09/08/2023) for Annual.   Myna Hidalgo, DO Attending Obstetrician & Gynecologist, [facility] for [facility], [facility]

## 2022-09-16 LAB — CYTOLOGY - PAP
Comment: NEGATIVE
Diagnosis: NEGATIVE
Diagnosis: REACTIVE
High risk HPV: NEGATIVE

## 2023-06-23 DIAGNOSIS — Z419 Encounter for procedure for purposes other than remedying health state, unspecified: Secondary | ICD-10-CM | POA: Diagnosis not present

## 2023-07-22 ENCOUNTER — Other Ambulatory Visit: Payer: Self-pay | Admitting: Obstetrics & Gynecology

## 2023-07-22 ENCOUNTER — Encounter: Payer: Self-pay | Admitting: Hematology

## 2023-07-22 DIAGNOSIS — Z1231 Encounter for screening mammogram for malignant neoplasm of breast: Secondary | ICD-10-CM

## 2023-07-23 DIAGNOSIS — Z419 Encounter for procedure for purposes other than remedying health state, unspecified: Secondary | ICD-10-CM | POA: Diagnosis not present

## 2023-07-29 ENCOUNTER — Encounter: Payer: Self-pay | Admitting: Hematology

## 2023-08-05 ENCOUNTER — Encounter: Payer: Self-pay | Admitting: Hematology

## 2023-08-05 ENCOUNTER — Ambulatory Visit
Admission: RE | Admit: 2023-08-05 | Discharge: 2023-08-05 | Disposition: A | Discharge status: Home / Self Care | Source: Ambulatory Visit | Attending: Obstetrics & Gynecology | Admitting: Obstetrics & Gynecology

## 2023-08-05 DIAGNOSIS — Z1231 Encounter for screening mammogram for malignant neoplasm of breast: Secondary | ICD-10-CM

## 2023-08-08 ENCOUNTER — Other Ambulatory Visit: Payer: Self-pay | Admitting: Obstetrics & Gynecology

## 2023-08-08 DIAGNOSIS — N6489 Other specified disorders of breast: Secondary | ICD-10-CM

## 2023-08-08 IMAGING — MG MM DIGITAL SCREENING BILAT W/ TOMO AND CAD
8 series · 8 of 24 positions shown · non-contrast
Comparison: None.

CLINICAL DATA: Screening.

EXAM:
DIGITAL SCREENING BILATERAL MAMMOGRAM WITH TOMOSYNTHESIS AND CAD
TECHNIQUE: Bilateral screening digital craniocaudal and mediolateral oblique
mammograms were obtained. Bilateral screening digital breast
tomosynthesis was performed. The images were evaluated with
computer-aided detection.

[R MLO synth-2D]
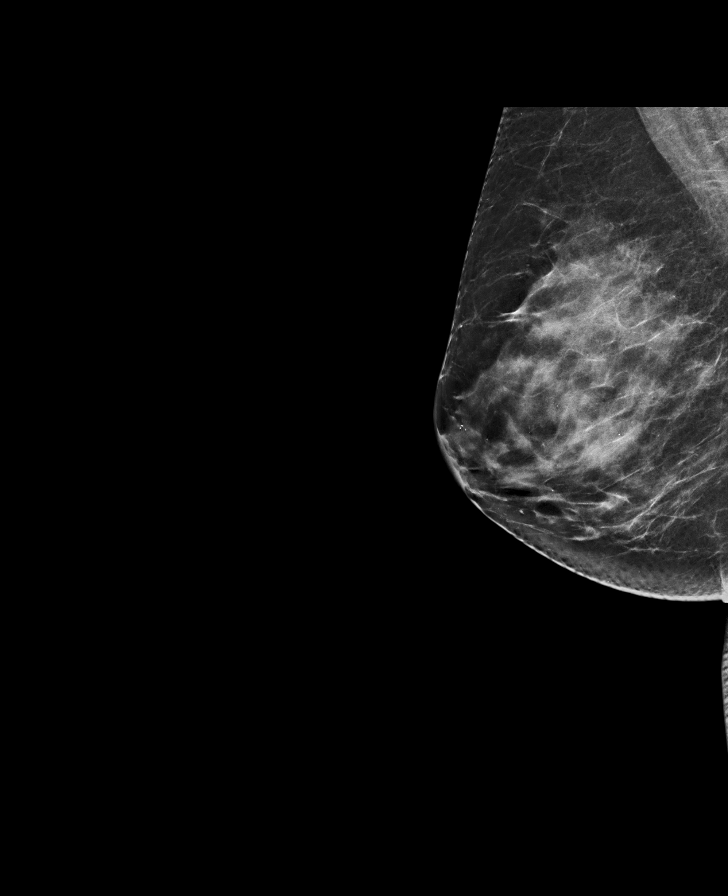

[R CC synth-2D]
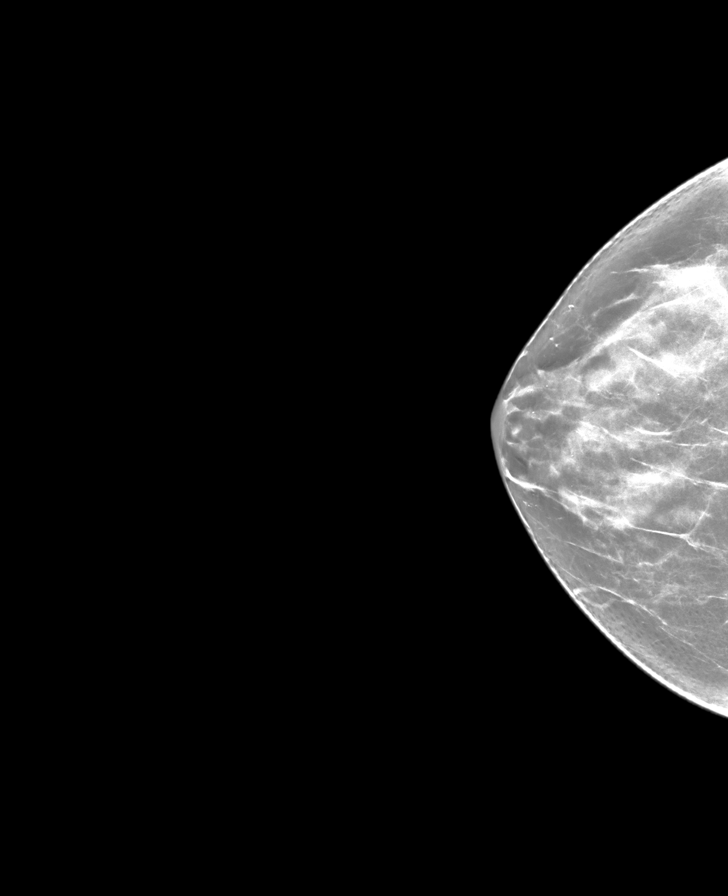

[L MLO synth-2D]
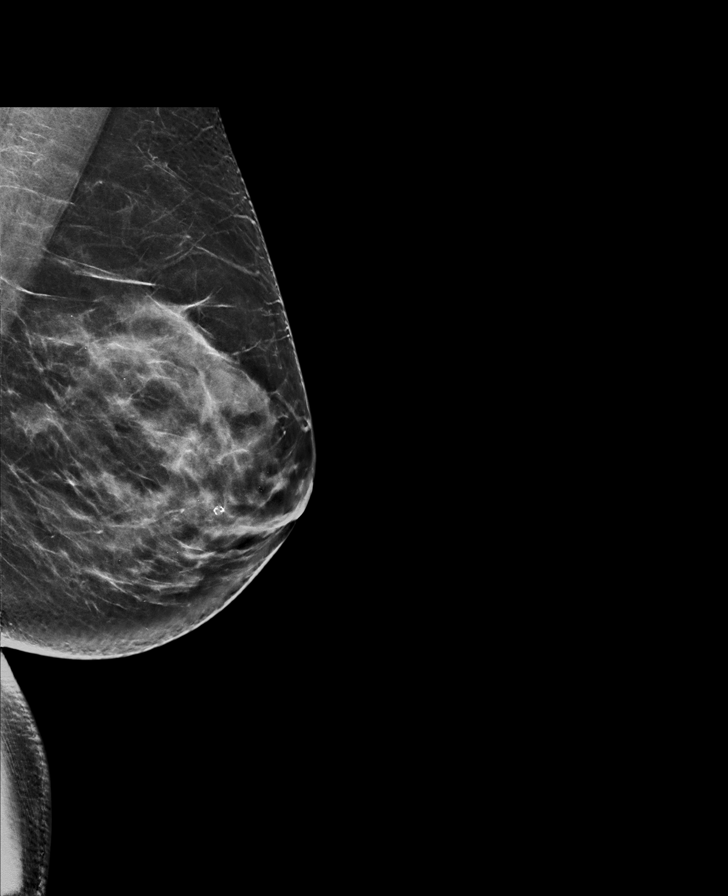

[L CC synth-2D]
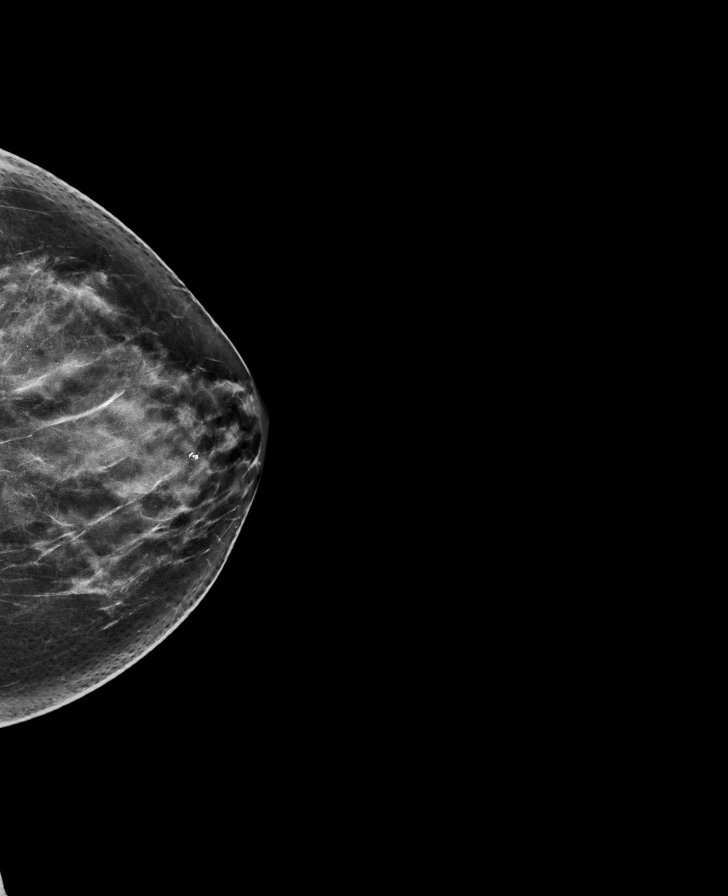

[L MLO tomo · tomo slice 37/74.0]
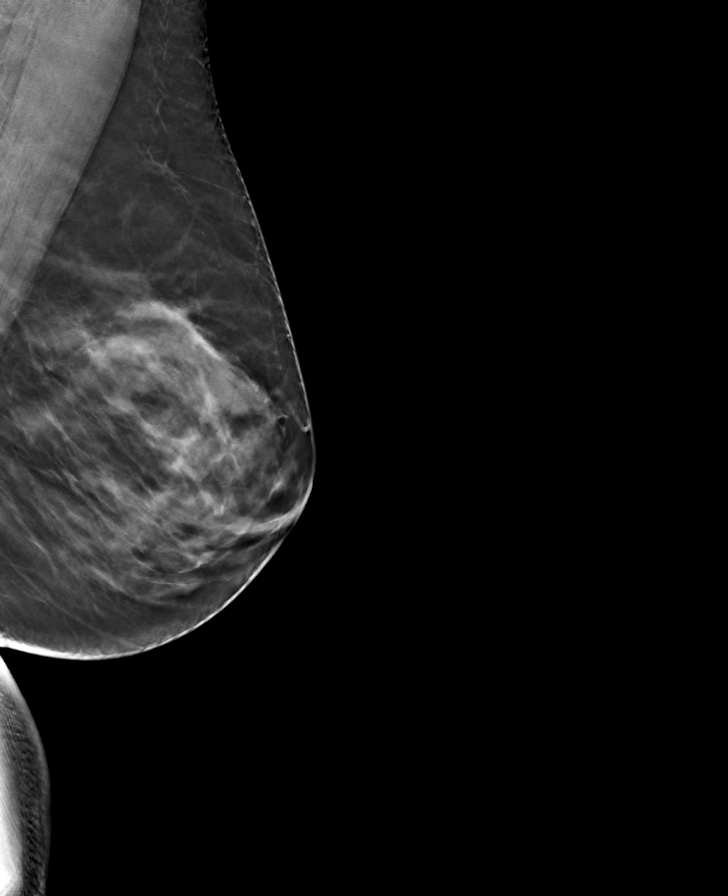

[L CC tomo · tomo slice 39/76.0]
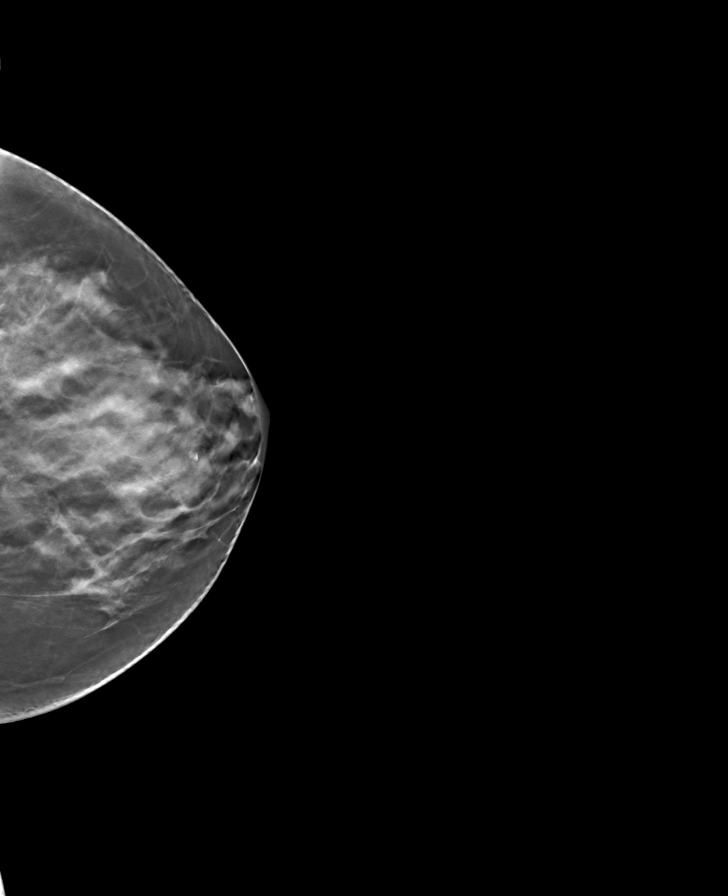

[R CC tomo · tomo slice 37/73.0]
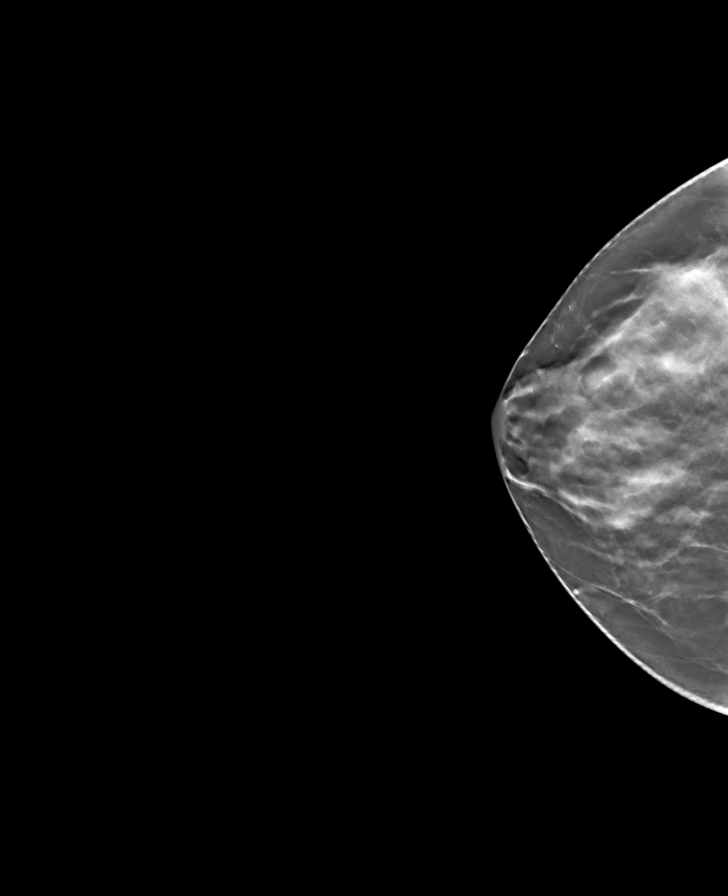

[R MLO tomo · tomo slice 39/78.0]
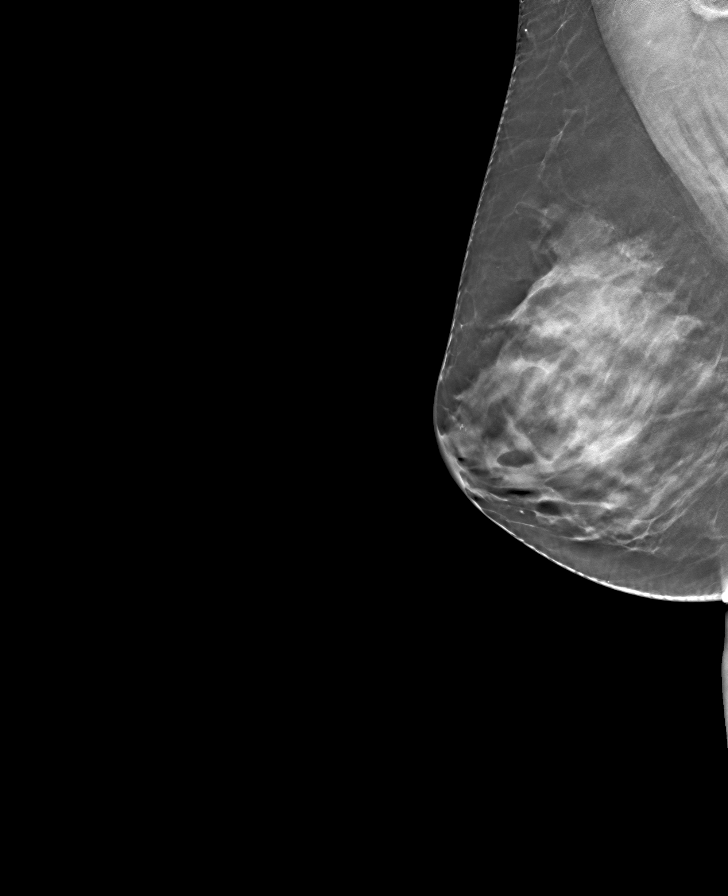

[8 of 24 positions shown; findings below may reference images not displayed]

ACR Breast Density Category c: The breast tissue is heterogeneously
dense, which may obscure small masses.
FINDINGS: In the left breast, a possible asymmetry and a possible focal
asymmetry warrant further evaluation. In the right breast, no
findings suspicious for malignancy.
IMPRESSION: Further evaluation is suggested for possible asymmetry and possible
focal asymmetry in the left breast.

RECOMMENDATION:
Diagnostic mammogram and possibly ultrasound of the left breast.
(Code:DH-[id number])

The patient will be contacted regarding the findings, and additional
imaging will be scheduled.

BI-RADS CATEGORY  0: Incomplete. Need additional imaging evaluation
and/or prior mammograms for comparison.

## 2023-08-15 ENCOUNTER — Ambulatory Visit: Payer: 59 | Admitting: Internal Medicine

## 2023-08-23 ENCOUNTER — Other Ambulatory Visit: Payer: Self-pay | Admitting: *Deleted

## 2023-08-23 DIAGNOSIS — Z419 Encounter for procedure for purposes other than remedying health state, unspecified: Secondary | ICD-10-CM | POA: Diagnosis not present

## 2023-09-02 ENCOUNTER — Ambulatory Visit
Admission: RE | Admit: 2023-09-02 | Discharge: 2023-09-02 | Disposition: A | Discharge status: Home / Self Care | Source: Ambulatory Visit | Attending: Obstetrics & Gynecology | Admitting: Obstetrics & Gynecology

## 2023-09-02 DIAGNOSIS — N6489 Other specified disorders of breast: Secondary | ICD-10-CM

## 2023-09-08 ENCOUNTER — Ambulatory Visit: Payer: Self-pay | Admitting: Obstetrics & Gynecology

## 2023-09-23 DIAGNOSIS — Z419 Encounter for procedure for purposes other than remedying health state, unspecified: Secondary | ICD-10-CM | POA: Diagnosis not present

## 2023-12-23 DIAGNOSIS — Z419 Encounter for procedure for purposes other than remedying health state, unspecified: Secondary | ICD-10-CM | POA: Diagnosis not present

## 2024-04-13 ENCOUNTER — Ambulatory Visit: Admitting: Obstetrics & Gynecology

## 2024-04-23 ENCOUNTER — Ambulatory Visit: Admitting: Obstetrics & Gynecology

## 2024-04-27 ENCOUNTER — Encounter: Admitting: Physician Assistant
